# Patient Record
Sex: Male | Born: 1935 | Race: White | Hispanic: No | Marital: Married | Smoking: Never smoker
Health system: Southern US, Community
[De-identification: ages and names within clinical notes are randomized; demographics above are authoritative.]

## PROBLEM LIST (undated history)

## (undated) DIAGNOSIS — B070 Plantar wart: Secondary | ICD-10-CM

## (undated) DIAGNOSIS — I2510 Atherosclerotic heart disease of native coronary artery without angina pectoris: Secondary | ICD-10-CM

## (undated) DIAGNOSIS — I739 Peripheral vascular disease, unspecified: Secondary | ICD-10-CM

## (undated) DIAGNOSIS — M810 Age-related osteoporosis without current pathological fracture: Secondary | ICD-10-CM

## (undated) DIAGNOSIS — K219 Gastro-esophageal reflux disease without esophagitis: Secondary | ICD-10-CM

## (undated) DIAGNOSIS — Z9289 Personal history of other medical treatment: Secondary | ICD-10-CM

## (undated) DIAGNOSIS — M1990 Unspecified osteoarthritis, unspecified site: Secondary | ICD-10-CM

## (undated) DIAGNOSIS — J309 Allergic rhinitis, unspecified: Secondary | ICD-10-CM

## (undated) DIAGNOSIS — N529 Male erectile dysfunction, unspecified: Secondary | ICD-10-CM

## (undated) DIAGNOSIS — E785 Hyperlipidemia, unspecified: Secondary | ICD-10-CM

## (undated) DIAGNOSIS — J984 Other disorders of lung: Secondary | ICD-10-CM

## (undated) DIAGNOSIS — I10 Essential (primary) hypertension: Secondary | ICD-10-CM

## (undated) DIAGNOSIS — Z8719 Personal history of other diseases of the digestive system: Secondary | ICD-10-CM

## (undated) DIAGNOSIS — N200 Calculus of kidney: Secondary | ICD-10-CM

## (undated) DIAGNOSIS — R319 Hematuria, unspecified: Secondary | ICD-10-CM

## (undated) DIAGNOSIS — M47817 Spondylosis without myelopathy or radiculopathy, lumbosacral region: Secondary | ICD-10-CM

## (undated) DIAGNOSIS — H3530 Unspecified macular degeneration: Secondary | ICD-10-CM

## (undated) DIAGNOSIS — M75120 Complete rotator cuff tear or rupture of unspecified shoulder, not specified as traumatic: Secondary | ICD-10-CM

## (undated) HISTORY — DX: Hyperlipidemia, unspecified: E78.5

## (undated) HISTORY — PX: TONSILLECTOMY: SUR1361

## (undated) HISTORY — DX: Gastro-esophageal reflux disease without esophagitis: K21.9

## (undated) HISTORY — DX: Other disorders of lung: J98.4

## (undated) HISTORY — PX: OTHER SURGICAL HISTORY: SHX169

## (undated) HISTORY — DX: Peripheral vascular disease, unspecified: I73.9

## (undated) HISTORY — DX: Calculus of kidney: N20.0

## (undated) HISTORY — PX: CATARACT EXTRACTION, BILATERAL: SHX1313

## (undated) HISTORY — DX: Atherosclerotic heart disease of native coronary artery without angina pectoris: I25.10

## (undated) HISTORY — DX: Age-related osteoporosis without current pathological fracture: M81.0

## (undated) HISTORY — DX: Hematuria, unspecified: R31.9

## (undated) HISTORY — DX: Unspecified macular degeneration: H35.30

## (undated) HISTORY — DX: Personal history of other medical treatment: Z92.89

## (undated) HISTORY — DX: Essential (primary) hypertension: I10

## (undated) HISTORY — PX: CARDIAC CATHETERIZATION: SHX172

## (undated) HISTORY — DX: Complete rotator cuff tear or rupture of unspecified shoulder, not specified as traumatic: M75.120

## (undated) HISTORY — DX: Allergic rhinitis, unspecified: J30.9

## (undated) HISTORY — DX: Spondylosis without myelopathy or radiculopathy, lumbosacral region: M47.817

## (undated) HISTORY — DX: Male erectile dysfunction, unspecified: N52.9

## (undated) HISTORY — DX: Plantar wart: B07.0

## (undated) HISTORY — DX: Unspecified osteoarthritis, unspecified site: M19.90

---

## 1958-01-03 HISTORY — PX: GANGLION CYST EXCISION: SHX1691

## 2001-06-25 LAB — HM COLONOSCOPY

## 2006-01-14 DIAGNOSIS — K219 Gastro-esophageal reflux disease without esophagitis: Secondary | ICD-10-CM | POA: Insufficient documentation

## 2006-05-16 ENCOUNTER — Ambulatory Visit: Payer: Self-pay | Admitting: Internal Medicine

## 2006-05-18 LAB — CONVERTED CEMR LAB
AST: 17 units/L (ref 0–37)
Albumin: 3.6 g/dL (ref 3.5–5.2)
BUN: 22 mg/dL (ref 6–23)
HDL: 51.6 mg/dL (ref >39.0)
Total Bilirubin: 0.9 mg/dL (ref 0.3–1.2)
Triglycerides: 65 mg/dL (ref 0–149)

## 2006-06-16 ENCOUNTER — Ambulatory Visit: Payer: Self-pay | Admitting: Cardiovascular Disease

## 2006-06-21 ENCOUNTER — Ambulatory Visit: Payer: Self-pay | Admitting: Internal Medicine

## 2006-06-23 ENCOUNTER — Ambulatory Visit: Payer: Self-pay | Admitting: Internal Medicine

## 2006-06-23 LAB — CONVERTED CEMR LAB
HDL: 60.8 mg/dL (ref >39.0)
LDL Cholesterol: 92 mg/dL (ref 0–99)
Total CHOL/HDL Ratio: 2.8
VLDL: 18 mg/dL (ref 0–40)
Vit D, 1,25-Dihydroxy: 37 (ref 20–57)

## 2006-08-26 ENCOUNTER — Ambulatory Visit: Payer: Self-pay | Admitting: Internal Medicine

## 2006-09-25 ENCOUNTER — Ambulatory Visit: Payer: Self-pay | Admitting: Internal Medicine

## 2006-10-10 ENCOUNTER — Encounter: Admission: RE | Admit: 2006-10-10 | Discharge: 2006-11-16 | Payer: Self-pay | Admitting: Internal Medicine

## 2006-10-17 ENCOUNTER — Ambulatory Visit: Payer: Self-pay | Admitting: Internal Medicine

## 2006-11-01 DIAGNOSIS — M67919 Unspecified disorder of synovium and tendon, unspecified shoulder: Secondary | ICD-10-CM | POA: Insufficient documentation

## 2006-11-01 DIAGNOSIS — M719 Bursopathy, unspecified: Secondary | ICD-10-CM

## 2006-11-01 DIAGNOSIS — M25559 Pain in unspecified hip: Secondary | ICD-10-CM

## 2006-11-01 DIAGNOSIS — E785 Hyperlipidemia, unspecified: Secondary | ICD-10-CM

## 2006-11-01 DIAGNOSIS — I10 Essential (primary) hypertension: Secondary | ICD-10-CM

## 2006-11-01 DIAGNOSIS — M47817 Spondylosis without myelopathy or radiculopathy, lumbosacral region: Secondary | ICD-10-CM

## 2006-11-01 DIAGNOSIS — M75120 Complete rotator cuff tear or rupture of unspecified shoulder, not specified as traumatic: Secondary | ICD-10-CM

## 2006-11-01 DIAGNOSIS — F528 Other sexual dysfunction not due to a substance or known physiological condition: Secondary | ICD-10-CM

## 2006-11-01 DIAGNOSIS — I2510 Atherosclerotic heart disease of native coronary artery without angina pectoris: Secondary | ICD-10-CM | POA: Insufficient documentation

## 2006-11-01 HISTORY — DX: Spondylosis without myelopathy or radiculopathy, lumbosacral region: M47.817

## 2006-11-01 HISTORY — DX: Complete rotator cuff tear or rupture of unspecified shoulder, not specified as traumatic: M75.120

## 2006-11-16 ENCOUNTER — Encounter: Payer: Self-pay | Admitting: Internal Medicine

## 2006-12-07 ENCOUNTER — Ambulatory Visit: Payer: Self-pay | Admitting: Cardiovascular Disease

## 2006-12-07 ENCOUNTER — Ambulatory Visit: Payer: Self-pay

## 2007-01-15 ENCOUNTER — Ambulatory Visit: Payer: Self-pay | Admitting: Internal Medicine

## 2007-01-15 DIAGNOSIS — M549 Dorsalgia, unspecified: Secondary | ICD-10-CM | POA: Insufficient documentation

## 2007-05-09 ENCOUNTER — Telehealth: Payer: Self-pay | Admitting: Internal Medicine

## 2007-07-19 ENCOUNTER — Ambulatory Visit: Payer: Self-pay | Admitting: Internal Medicine

## 2007-07-19 LAB — CONVERTED CEMR LAB
Alkaline Phosphatase: 64 units/L (ref 39–117)
Bilirubin, Direct: 0.1 mg/dL (ref 0.0–0.3)
GFR calc Af Amer: 68 mL/min
GFR calc non Af Amer: 56 mL/min
LDL Cholesterol: 87 mg/dL (ref 0–99)
Potassium: 4 meq/L (ref 3.5–5.1)
Sodium: 139 meq/L (ref 135–145)
TSH: 0.99 microintl units/mL (ref 0.35–5.50)
Total Bilirubin: 0.8 mg/dL (ref 0.3–1.2)
Total CHOL/HDL Ratio: 2.9
VLDL: 13 mg/dL (ref 0–40)

## 2007-07-25 ENCOUNTER — Ambulatory Visit: Payer: Self-pay | Admitting: Internal Medicine

## 2007-07-25 DIAGNOSIS — M810 Age-related osteoporosis without current pathological fracture: Secondary | ICD-10-CM | POA: Insufficient documentation

## 2007-07-25 DIAGNOSIS — I739 Peripheral vascular disease, unspecified: Secondary | ICD-10-CM

## 2007-08-07 ENCOUNTER — Encounter: Payer: Self-pay | Admitting: Internal Medicine

## 2007-08-10 ENCOUNTER — Encounter: Payer: Self-pay | Admitting: Internal Medicine

## 2007-08-13 ENCOUNTER — Encounter: Payer: Self-pay | Admitting: Internal Medicine

## 2007-08-15 ENCOUNTER — Encounter: Payer: Self-pay | Admitting: Internal Medicine

## 2007-12-12 ENCOUNTER — Ambulatory Visit: Payer: Self-pay | Admitting: Cardiovascular Disease

## 2008-01-24 ENCOUNTER — Ambulatory Visit: Payer: Self-pay | Admitting: Internal Medicine

## 2008-01-28 ENCOUNTER — Telehealth (INDEPENDENT_AMBULATORY_CARE_PROVIDER_SITE_OTHER): Payer: Self-pay | Admitting: *Deleted

## 2008-01-29 ENCOUNTER — Ambulatory Visit: Payer: Self-pay | Admitting: Internal Medicine

## 2008-01-30 ENCOUNTER — Encounter: Payer: Self-pay | Admitting: Internal Medicine

## 2008-01-30 ENCOUNTER — Encounter: Admission: RE | Admit: 2008-01-30 | Discharge: 2008-01-30 | Payer: Self-pay | Admitting: Internal Medicine

## 2008-02-05 ENCOUNTER — Other Ambulatory Visit: Admission: RE | Admit: 2008-02-05 | Discharge: 2008-02-05 | Payer: Self-pay | Admitting: Otolaryngology

## 2008-02-05 ENCOUNTER — Encounter: Payer: Self-pay | Admitting: Internal Medicine

## 2008-06-04 ENCOUNTER — Ambulatory Visit: Payer: Self-pay | Admitting: Internal Medicine

## 2008-06-04 DIAGNOSIS — R10814 Left lower quadrant abdominal tenderness: Secondary | ICD-10-CM | POA: Insufficient documentation

## 2008-06-04 DIAGNOSIS — R5383 Other fatigue: Secondary | ICD-10-CM

## 2008-06-05 LAB — CONVERTED CEMR LAB: Vit D, 25-Hydroxy: 26 ng/mL — ABNORMAL LOW (ref 30–89)

## 2008-06-06 LAB — CONVERTED CEMR LAB
ALT: 25 units/L (ref 0–53)
Albumin: 4 g/dL (ref 3.5–5.2)
Alkaline Phosphatase: 53 units/L (ref 39–117)
Basophils Relative: 0.6 % (ref 0.0–3.0)
Bilirubin Urine: NEGATIVE
Bilirubin, Direct: 0.1 mg/dL (ref 0.0–0.3)
CO2: 30 meq/L (ref 19–32)
Chloride: 108 meq/L (ref 96–112)
Eosinophils Absolute: 0.2 10*3/uL (ref 0.0–0.7)
Eosinophils Relative: 3.2 % (ref 0.0–5.0)
Folate: 10.5 ng/mL
Hemoglobin, Urine: NEGATIVE
Hemoglobin: 14.7 g/dL (ref 13.0–17.0)
Ketones, ur: NEGATIVE mg/dL
LDL Cholesterol: 75 mg/dL (ref 0–99)
Leukocytes, UA: NEGATIVE
Lymphocytes Relative: 22.8 % (ref 12.0–46.0)
MCHC: 34.5 g/dL (ref 30.0–36.0)
MCV: 94.8 fL (ref 78.0–100.0)
Monocytes Absolute: 0.6 10*3/uL (ref 0.1–1.0)
Neutro Abs: 3.8 10*3/uL (ref 1.4–7.7)
Nitrite: NEGATIVE
RBC: 4.5 M/uL (ref 4.22–5.81)
Sodium: 143 meq/L (ref 135–145)
Total CHOL/HDL Ratio: 3
Total Protein: 6.9 g/dL (ref 6.0–8.3)
Triglycerides: 108 mg/dL (ref 0.0–149.0)
Urobilinogen, UA: 0.2 (ref 0.0–1.0)
Vitamin B-12: 289 pg/mL (ref 211–911)
WBC: 6 10*3/uL (ref 4.5–10.5)
pH: 7 (ref 5.0–8.0)

## 2008-06-14 ENCOUNTER — Encounter: Admission: RE | Admit: 2008-06-14 | Discharge: 2008-06-14 | Payer: Self-pay | Admitting: Internal Medicine

## 2008-07-02 ENCOUNTER — Telehealth (INDEPENDENT_AMBULATORY_CARE_PROVIDER_SITE_OTHER): Payer: Self-pay | Admitting: *Deleted

## 2008-07-16 ENCOUNTER — Telehealth: Payer: Self-pay | Admitting: Internal Medicine

## 2008-08-28 ENCOUNTER — Encounter: Payer: Self-pay | Admitting: Internal Medicine

## 2008-09-01 ENCOUNTER — Telehealth: Payer: Self-pay | Admitting: Internal Medicine

## 2008-09-04 ENCOUNTER — Encounter: Payer: Self-pay | Admitting: Internal Medicine

## 2008-09-29 ENCOUNTER — Ambulatory Visit: Payer: Self-pay | Admitting: Internal Medicine

## 2008-09-29 DIAGNOSIS — H6690 Otitis media, unspecified, unspecified ear: Secondary | ICD-10-CM | POA: Insufficient documentation

## 2008-09-29 DIAGNOSIS — R42 Dizziness and giddiness: Secondary | ICD-10-CM | POA: Insufficient documentation

## 2008-12-05 ENCOUNTER — Encounter: Payer: Self-pay | Admitting: Cardiovascular Disease

## 2008-12-08 ENCOUNTER — Ambulatory Visit: Payer: Self-pay

## 2008-12-08 ENCOUNTER — Ambulatory Visit: Payer: Self-pay | Admitting: Cardiovascular Disease

## 2009-01-30 ENCOUNTER — Ambulatory Visit: Payer: Self-pay | Admitting: Internal Medicine

## 2009-01-30 DIAGNOSIS — H3530 Unspecified macular degeneration: Secondary | ICD-10-CM | POA: Insufficient documentation

## 2009-01-30 DIAGNOSIS — R1909 Other intra-abdominal and pelvic swelling, mass and lump: Secondary | ICD-10-CM

## 2009-01-30 HISTORY — DX: Unspecified macular degeneration: H35.30

## 2009-01-30 LAB — CONVERTED CEMR LAB
Albumin: 4.1 g/dL (ref 3.5–5.2)
Alkaline Phosphatase: 61 units/L (ref 39–117)
Basophils Absolute: 0 10*3/uL (ref 0.0–0.1)
Bilirubin, Direct: 0.2 mg/dL (ref 0.0–0.3)
CO2: 31 meq/L (ref 19–32)
Calcium: 9.2 mg/dL (ref 8.4–10.5)
Creatinine, Ser: 1.3 mg/dL (ref 0.4–1.5)
GFR calc non Af Amer: 56.05 mL/min (ref >60)
HCT: 44.3 % (ref 39.0–52.0)
Hemoglobin, Urine: NEGATIVE
Hemoglobin: 14.4 g/dL (ref 13.0–17.0)
Lymphs Abs: 2.2 10*3/uL (ref 0.7–4.0)
MCHC: 32.4 g/dL (ref 30.0–36.0)
MCV: 97 fL (ref 78.0–100.0)
Monocytes Absolute: 0.6 10*3/uL (ref 0.1–1.0)
Monocytes Relative: 9.3 % (ref 3.0–12.0)
Neutro Abs: 3.7 10*3/uL (ref 1.4–7.7)
Nitrite: NEGATIVE
Platelets: 227 10*3/uL (ref 150.0–400.0)
RDW: 12.3 % (ref 11.5–14.6)
Sodium: 143 meq/L (ref 135–145)
Specific Gravity, Urine: 1.025 (ref 1.000–1.030)
Total Protein, Urine: NEGATIVE mg/dL
Total Protein: 7.1 g/dL (ref 6.0–8.3)
Urobilinogen, UA: 0.2 (ref 0.0–1.0)

## 2009-02-02 ENCOUNTER — Ambulatory Visit: Payer: Self-pay | Admitting: Cardiology

## 2009-03-04 ENCOUNTER — Ambulatory Visit: Payer: Self-pay | Admitting: Internal Medicine

## 2009-03-04 DIAGNOSIS — M25519 Pain in unspecified shoulder: Secondary | ICD-10-CM

## 2009-03-04 DIAGNOSIS — N529 Male erectile dysfunction, unspecified: Secondary | ICD-10-CM | POA: Insufficient documentation

## 2009-05-12 ENCOUNTER — Ambulatory Visit: Payer: Self-pay | Admitting: Internal Medicine

## 2009-05-12 DIAGNOSIS — J309 Allergic rhinitis, unspecified: Secondary | ICD-10-CM | POA: Insufficient documentation

## 2009-05-21 ENCOUNTER — Encounter: Payer: Self-pay | Admitting: Internal Medicine

## 2009-05-21 ENCOUNTER — Ambulatory Visit: Payer: Self-pay

## 2009-05-21 ENCOUNTER — Ambulatory Visit: Payer: Self-pay | Admitting: Internal Medicine

## 2009-05-21 ENCOUNTER — Ambulatory Visit (HOSPITAL_COMMUNITY): Admission: RE | Admit: 2009-05-21 | Discharge: 2009-05-21 | Payer: Self-pay | Admitting: Internal Medicine

## 2009-05-25 ENCOUNTER — Telehealth: Payer: Self-pay | Admitting: Internal Medicine

## 2009-08-05 ENCOUNTER — Telehealth (INDEPENDENT_AMBULATORY_CARE_PROVIDER_SITE_OTHER): Payer: Self-pay | Admitting: *Deleted

## 2009-08-07 ENCOUNTER — Encounter: Payer: Self-pay | Admitting: Internal Medicine

## 2009-09-10 ENCOUNTER — Ambulatory Visit: Payer: Self-pay | Admitting: Internal Medicine

## 2009-09-10 DIAGNOSIS — B070 Plantar wart: Secondary | ICD-10-CM

## 2009-09-10 DIAGNOSIS — M899 Disorder of bone, unspecified: Secondary | ICD-10-CM | POA: Insufficient documentation

## 2009-09-10 DIAGNOSIS — M949 Disorder of cartilage, unspecified: Secondary | ICD-10-CM

## 2009-09-10 HISTORY — DX: Plantar wart: B07.0

## 2009-10-01 ENCOUNTER — Encounter: Payer: Self-pay | Admitting: Internal Medicine

## 2009-10-07 ENCOUNTER — Emergency Department (HOSPITAL_COMMUNITY): Admission: EM | Admit: 2009-10-07 | Discharge: 2009-10-07 | Payer: Self-pay | Admitting: Emergency Medicine

## 2009-10-07 ENCOUNTER — Ambulatory Visit: Payer: Self-pay | Admitting: Cardiovascular Disease

## 2009-10-13 ENCOUNTER — Telehealth (INDEPENDENT_AMBULATORY_CARE_PROVIDER_SITE_OTHER): Payer: Self-pay | Admitting: Radiology

## 2009-10-14 ENCOUNTER — Ambulatory Visit: Payer: Self-pay | Admitting: Cardiovascular Disease

## 2009-10-14 ENCOUNTER — Encounter: Payer: Self-pay | Admitting: Cardiovascular Disease

## 2009-10-14 ENCOUNTER — Encounter (HOSPITAL_COMMUNITY)
Admission: RE | Admit: 2009-10-14 | Discharge: 2009-10-30 | Payer: Self-pay | Source: Home / Self Care | Admitting: Cardiovascular Disease

## 2009-10-14 ENCOUNTER — Ambulatory Visit: Payer: Self-pay

## 2009-10-19 ENCOUNTER — Ambulatory Visit: Payer: Self-pay | Admitting: Cardiovascular Disease

## 2009-12-24 ENCOUNTER — Ambulatory Visit: Payer: Self-pay | Admitting: Internal Medicine

## 2009-12-24 DIAGNOSIS — IMO0002 Reserved for concepts with insufficient information to code with codable children: Secondary | ICD-10-CM

## 2010-01-25 ENCOUNTER — Other Ambulatory Visit: Payer: Self-pay | Admitting: Internal Medicine

## 2010-01-25 ENCOUNTER — Ambulatory Visit
Admission: RE | Admit: 2010-01-25 | Discharge: 2010-01-25 | Payer: Self-pay | Source: Home / Self Care | Attending: Internal Medicine | Admitting: Internal Medicine

## 2010-01-25 DIAGNOSIS — J984 Other disorders of lung: Secondary | ICD-10-CM

## 2010-01-25 HISTORY — DX: Other disorders of lung: J98.4

## 2010-01-25 LAB — CBC WITH DIFFERENTIAL/PLATELET
Basophils Relative: 0.5 % (ref 0.0–3.0)
Eosinophils Relative: 3.2 % (ref 0.0–5.0)
Lymphocytes Relative: 26.8 % (ref 12.0–46.0)
Monocytes Relative: 8.6 % (ref 3.0–12.0)
Neutrophils Relative %: 60.9 % (ref 43.0–77.0)
RBC: 4.59 Mil/uL (ref 4.22–5.81)
WBC: 6.1 10*3/uL (ref 4.5–10.5)

## 2010-01-25 LAB — HEPATIC FUNCTION PANEL
ALT: 21 U/L (ref 0–53)
Alkaline Phosphatase: 61 U/L (ref 39–117)
Bilirubin, Direct: 0.1 mg/dL (ref 0.0–0.3)
Total Protein: 6.7 g/dL (ref 6.0–8.3)

## 2010-01-25 LAB — BASIC METABOLIC PANEL
Calcium: 9.3 mg/dL (ref 8.4–10.5)
Creatinine, Ser: 1.3 mg/dL (ref 0.4–1.5)

## 2010-01-25 LAB — LIPID PANEL
LDL Cholesterol: 61 mg/dL (ref 0–99)
Total CHOL/HDL Ratio: 2
Triglycerides: 98 mg/dL (ref 0.0–149.0)

## 2010-01-26 ENCOUNTER — Encounter: Payer: Self-pay | Admitting: Internal Medicine

## 2010-01-26 ENCOUNTER — Ambulatory Visit
Admission: RE | Admit: 2010-01-26 | Discharge: 2010-01-26 | Payer: Self-pay | Source: Home / Self Care | Attending: Internal Medicine | Admitting: Internal Medicine

## 2010-01-31 LAB — CONVERTED CEMR LAB
Basophils Relative: 0.6 % (ref 0.0–3.0)
Calcium: 9.2 mg/dL (ref 8.4–10.5)
Eosinophils Relative: 2.7 % (ref 0.0–5.0)
GFR calc non Af Amer: 62.03 mL/min (ref >60)
HCT: 42.3 % (ref 39.0–52.0)
Hemoglobin: 14.5 g/dL (ref 13.0–17.0)
Lymphocytes Relative: 22.8 % (ref 12.0–46.0)
Monocytes Relative: 7.5 % (ref 3.0–12.0)
Neutro Abs: 4.2 10*3/uL (ref 1.4–7.7)
Potassium: 4.3 meq/L (ref 3.5–5.1)
RBC: 4.5 M/uL (ref 4.22–5.81)
Sodium: 142 meq/L (ref 135–145)

## 2010-02-04 NOTE — Assessment & Plan Note (Signed)
Summary: 1 MO ROV /NWS #   Vital Signs:  Patient profile:   77 year old [name] Height:      67 inches Weight:      133 pounds BMI:     20.91 O2 Sat:      98 % on Room air Temp:     96.7 degrees F oral Pulse rate:   89 / minute BP sitting:   140 / 70  (left arm) Cuff size:   regular  Vitals Entered ByZella Ball Ewing (March 04, 2009 10:22 AM)  O2 Flow:  Room air CC: 1 Mo Followup/RE   Primary Care Provider:  Corwin Levins MD  CC:  1 Mo Followup/RE.  History of Present Illness: c/o bilat increased shoudler pain  - has hx of bursitis on the left, and rot cuff tendonitis on the right; conts to lift the dumbells for excercise on occasion; has seen ortho before  - dr Farris Has; no neck pain and no radicular symptoms;  Pt denies CP, sob, doe, wheezing, orthopnea, pnd, worsening LE edema, palps, dizziness or syncope   Pt denies new neuro symptoms such as headache, facial or extremity weakness   Recent CT neg for groin mass and pt states lump has resolved;  no recent fever, wt loss, night sweats , or other consittutional symtpoms.    Problems Prior to Update: 1)  Erectile Dysfunction, Organic  (ICD-607.84) 2)  Shoulder Pain, Bilateral  (ICD-719.41) 3)  Preventive Health Care  (ICD-V70.0) 4)  Groin Mass, Left  (ICD-789.39) 5)  Macular Degeneration, Bilateral  (ICD-362.50) 6)  Coronary Artery Disease  (ICD-414.00) 7)  Peripheral Vascular Disease  (ICD-443.9) 8)  Hypertension  (ICD-401.9) 9)  Dyslipidemia  (ICD-272.4) 10)  Gerd  (ICD-530.81) 11)  Fatigue  (ICD-780.79) 12)  Vertigo  (ICD-780.4) 13)  Rash-nonvesicular  (ICD-782.1) 14)  Otitis Media, Acute, Left  (ICD-382.9) 15)  Abdominal Tenderness Left Lower Quadrant  (ICD-789.64) 16)  Swelling Mass or Lump in Head and Neck  (ICD-784.2) 17)  Osteoporosis  (ICD-733.00) 18)  Rectal Pain  (ICD-569.42) 19)  Back Pain  (ICD-724.5) 20)  Hip Pain, Chronic  (ICD-719.45) 21)  Erectile Dysfunction  (ICD-302.72) 22)  Bursitis, Left Shoulder   (ICD-726.10) 23)  Hx of Rotator Cuff Tear  (ICD-727.61) 24)  Spondylosis, Lumbar  (ICD-721.3)  Medications Prior to Update: 1)  Lisinopril 10 Mg  Tabs (Lisinopril) .... One By Mouth Once Daily 2)  Nexium 40 Mg  Cpdr (Esomeprazole Magnesium) .Marland Kitchen.. 1 By Mouth Once Daily 3)  Low-Dose Aspirin 81 Mg  Tabs (Aspirin) .... One By Mouth Once Daily 4)  Crestor 20 Mg  Tabs (Rosuvastatin Calcium) .Marland Kitchen.. 1 By Mouth Once Daily 5)  Nitroquick 0.4 Mg  Subl (Nitroglycerin) .... One By Mouth Sl As Needed 6)  Tramadol-Acetaminophen 37.5-325 Mg  Tabs (Tramadol-Acetaminophen) .... One By Mouth Two Times A Day As Needed 7)  Vitamin D 1000 Unit Tabs (Cholecalciferol) .Marland Kitchen.. 1 By Mouth Once Daily 8)  Centrum Silver  Tabs (Multiple Vitamins-Minerals) .Marland Kitchen.. 1 By Mouth Once Daily  Current Medications (verified): 1)  Lisinopril 10 Mg  Tabs (Lisinopril) .... One By Mouth Once Daily 2)  Nexium 40 Mg  Cpdr (Esomeprazole Magnesium) .Marland Kitchen.. 1 By Mouth Once Daily 3)  Low-Dose Aspirin 81 Mg  Tabs (Aspirin) .... One By Mouth Once Daily 4)  Crestor 20 Mg  Tabs (Rosuvastatin Calcium) .Marland Kitchen.. 1 By Mouth Once Daily 5)  Nitroquick 0.4 Mg  Subl (Nitroglycerin) .... One By Mouth Sl As Needed  6)  Tramadol-Acetaminophen 37.5-325 Mg  Tabs (Tramadol-Acetaminophen) .... One By Mouth Two Times A Day As Needed 7)  Vitamin D 1000 Unit Tabs (Cholecalciferol) .Marland Kitchen.. 1 By Mouth Once Daily 8)  Centrum Silver  Tabs (Multiple Vitamins-Minerals) .Marland Kitchen.. 1 By Mouth Once Daily 9)  Viagra 100 Mg Tabs (Sildenafil Citrate) .Marland Kitchen.. 1 By Mouth Once Daily As Needed  Allergies (verified): 1)  ! Protonix (Pantoprazole Sodium) 2)  ! Aciphex (Rabeprazole Sodium) 3)  ! Biaxin 4)  * Cialis  Past History:  Past Surgical History: Last updated: 12/04/2008 Cataract extraction - bilat Tonsillectomy Inguinal herniorrhaphy - bilat  Social History: Last updated: 12/04/2008 Retired Never Smoked Alcohol use-no He has been married for the last 57 years.  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   Risk Factors: Smoking Status: never (01/15/2007)  Past Medical History: Current Problems:  CORONARY ARTERY DISEASE (ICD-414.00)-Nonobstructive PERIPHERAL VASCULAR DISEASE (ICD-443.9) HYPERTENSION (ICD-401.9) DYSLIPIDEMIA (ICD-272.4) GERD (ICD-530.81) FATIGUE (ICD-780.79) VERTIGO (ICD-780.4) RASH-NONVESICULAR (ICD-782.1) OTITIS MEDIA, ACUTE, LEFT (ICD-382.9) ABDOMINAL TENDERNESS LEFT LOWER QUADRANT (ICD-789.64) SWELLING MASS OR LUMP IN HEAD AND NECK (ICD-784.2) OSTEOPOROSIS (ICD-733.00) RECTAL PAIN (ICD-569.42) BACK PAIN (ICD-724.5) HIP PAIN, CHRONIC (ICD-719.45) ERECTILE DYSFUNCTION (ICD-302.72) BURSITIS, LEFT SHOULDER (ICD-726.10) Hx of ROTATOR CUFF TEAR (ICD-727.61) SPONDYLOSIS, LUMBAR (ICD-721.3) macular degeneration  - dr Hazle Quant E.D.  Review of Systems       all otherwise negative per pt - except for onset ED symtpoms recently  - requests viagra trial  Physical Exam  General:  alert and well-developed.   Head:  normocephalic and atraumatic.   Eyes:  pupils round.   Ears:  R ear normal and L ear normal.   Nose:  no external deformity and no nasal discharge.   Mouth:  no gingival abnormalities and pharynx pink and moist.   Neck:  supple and no masses.   Lungs:  normal respiratory effort and normal breath sounds.   Heart:  normal rate and regular rhythm.   Msk:  bilat shoudler with tender to bicep and bursa areas, worse to abduct Extremities:  no edema, no erythema  Neurologic:  cranial nerves II-XII intact and strength normal in all extremities.   Cervical Nodes:  No lymphadenopathy noted Axillary Nodes:  No palpable lymphadenopathy Inguinal Nodes:  No significant adenopathy   Impression & Recommendations:  Problem # 1:  SHOULDER PAIN, BILATERAL (ICD-719.41)  His updated medication list for this problem includes:    Low-dose Aspirin 81 Mg Tabs (Aspirin)  ..... One by mouth once daily    Tramadol-acetaminophen 37.5-325 Mg Tabs (Tramadol-acetaminophen) ..... One by mouth two times a day as needed  Orders: Orthopedic Surgeon Referral (Ortho Surgeon) treat as above, f/u any worsening signs or symptoms , refer to ortho  Problem # 2:  GROIN MASS, LEFT (ICD-789.39) resolved, ct neg - to follow for any recurrence  Problem # 3:  HYPERTENSION (ICD-401.9)  His updated medication list for this problem includes:    Lisinopril 10 Mg Tabs (Lisinopril) ..... One by mouth once daily  BP today: 140/70 Prior BP: 120/68 (01/30/2009)  Labs Reviewed: K+: 4.1 (01/30/2009) Creat: : 1.3 (01/30/2009)   Chol: 160 (06/04/2008)   HDL: 63.20 (06/04/2008)   LDL: 75 (06/04/2008)   TG: 108.0 (06/04/2008) stable overall by hx and exam, ok to continue meds/tx as is   Problem # 4:  ERECTILE DYSFUNCTION, ORGANIC (ICD-607.84)  for  viagra , with samples and rx today  His updated medication list for this problem includes:    Viagra 100 Mg Tabs (Sildenafil citrate) .Marland Kitchen... 1 by mouth once daily as needed  Complete Medication List: 1)  Lisinopril 10 Mg Tabs (Lisinopril) .... One by mouth once daily 2)  Nexium 40 Mg Cpdr (Esomeprazole magnesium) .Marland Kitchen.. 1 by mouth once daily 3)  Low-dose Aspirin 81 Mg Tabs (Aspirin) .... One by mouth once daily 4)  Crestor 20 Mg Tabs (Rosuvastatin calcium) .Marland Kitchen.. 1 by mouth once daily 5)  Nitroquick 0.4 Mg Subl (Nitroglycerin) .... One by mouth sl as needed 6)  Tramadol-acetaminophen 37.5-325 Mg Tabs (Tramadol-acetaminophen) .... One by mouth two times a day as needed 7)  Vitamin D 1000 Unit Tabs (Cholecalciferol) .Marland Kitchen.. 1 by mouth once daily 8)  Centrum Silver Tabs (Multiple vitamins-minerals) .Marland Kitchen.. 1 by mouth once daily 9)  Viagra 100 Mg Tabs (Sildenafil citrate) .Marland Kitchen.. 1 by mouth once daily as needed  Patient Instructions: 1)  Please take all new medications as prescribed 2)  Continue all previous medications as before this visit  3)  you  are given the samples today 4)  You will be contacted about the referral(s) to: Orthopedic - dr Farris Has 5)  Please schedule a follow-up appointment in 6 months or sooner if needed Prescriptions: VIAGRA 100 MG TABS (SILDENAFIL CITRATE) 1 by mouth once daily as needed  #5 x 11   Entered and Authorized by:   Corwin Levins MD   Signed by:   Corwin Levins MD on 03/04/2009   Method used:   Print then Give to Patient   RxID:   [id number]

## 2010-02-04 NOTE — Medication Information (Signed)
Summary: Nexium Approved/[employer]  Nexium Approved/[employer]   Imported By: Sherian Rein 08/10/2009 12:10:02  _____________________________________________________________________  External Attachment:    Type:   Image     Comment:   External Document

## 2010-02-04 NOTE — Miscellaneous (Signed)
Summary: BONE DENSITY  Clinical Lists Changes  Orders: Added new Test order of T-Lumbar Vertebral Assessment (77082) - Signed 

## 2010-02-04 NOTE — Assessment & Plan Note (Signed)
Summary: LEFT LOWER MASS/ABD--STC   Vital Signs:  Patient profile:   77 year old male Height:      66 inches Weight:      133 pounds BMI:     21.54 O2 Sat:      99 % on Room air Temp:     97 degrees F oral Pulse rate:   66 / minute BP sitting:   120 / 68  (left arm) Cuff size:   regular  Vitals Entered ByZella Ball Ewing (January 30, 2009 11:03 AM)  O2 Flow:  Room air  Preventive Care Screening  Last Flu Shot:    Date:  11/03/2008    Results:  given   CC: Lower left abdominal mass,lump/RE   Primary Care Provider:  Corwin Levins MD  CC:  Lower left abdominal mass and lump/RE.  History of Present Illness: [facility] and then "they got into other things";  somehow he was referred to a cardiologist; who noted a left lower abd lump/ swelling;  he is unaware of any problem such as pain, or change in bowel or bladder , fever, night sweats, wt loss, weakness or falls alhtough he gets "more tired in the legs" easier than last year and attributes this to geriatric decline;  stilldoing yardwork  - rakes leaves and mows the lawn. Had CXR at [facility] but he never heard results Pt denies CP, sob, doe, wheezing, orthopnea, pnd, worsening LE edema, palps, dizziness or syncope   Pt denies new neuro symptoms such as headache, facial or extremity weakness  Here for wellness Diet: Heart Healthy or DM if diabetic Physical Activities: Sedentary Depression/mood screen: Negative Hearing: Intact bilateral wiht mild loss only Visual Acuity: Grossly normal with glasses ADL's: Capable  Fall Risk: None Home Safety: Good End-of-Life Planning: Advance directive - Full code/I agree     Problems Prior to Update: 1)  Groin Mass, Left  (ICD-789.39) 2)  Macular Degeneration, Bilateral  (ICD-362.50) 3)  Coronary Artery Disease  (ICD-414.00) 4)  Peripheral Vascular Disease  (ICD-443.9) 5)  Hypertension  (ICD-401.9) 6)  Dyslipidemia  (ICD-272.4) 7)  Gerd  (ICD-530.81) 8)  Fatigue   (ICD-780.79) 9)  Vertigo  (ICD-780.4) 10)  Rash-nonvesicular  (ICD-782.1) 11)  Otitis Media, Acute, Left  (ICD-382.9) 12)  Abdominal Tenderness Left Lower Quadrant  (ICD-789.64) 13)  Swelling Mass or Lump in Head and Neck  (ICD-784.2) 14)  Osteoporosis  (ICD-733.00) 15)  Rectal Pain  (ICD-569.42) 16)  Back Pain  (ICD-724.5) 17)  Hip Pain, Chronic  (ICD-719.45) 18)  Erectile Dysfunction  (ICD-302.72) 19)  Bursitis, Left Shoulder  (ICD-726.10) 20)  Hx of Rotator Cuff Tear  (ICD-727.61) 21)  Spondylosis, Lumbar  (ICD-721.3)  Medications Prior to Update: 1)  Lisinopril 10 Mg  Tabs (Lisinopril) .... One By Mouth Once Daily 2)  Nexium 40 Mg  Cpdr (Esomeprazole Magnesium) .Marland Kitchen.. 1 By Mouth Once Daily 3)  Low-Dose Aspirin 81 Mg  Tabs (Aspirin) .... One By Mouth Once Daily 4)  Crestor 20 Mg  Tabs (Rosuvastatin Calcium) .Marland Kitchen.. 1 By Mouth Once Daily 5)  Nitroquick 0.4 Mg  Subl (Nitroglycerin) .... One By Mouth Sl As Needed 6)  Tramadol-Acetaminophen 37.5-325 Mg  Tabs (Tramadol-Acetaminophen) .... One By Mouth Two Times A Day As Needed 7)  Vitamin D 1000 Unit Tabs (Cholecalciferol) .Marland Kitchen.. 1 By Mouth Once Daily 8)  Centrum Silver  Tabs (Multiple Vitamins-Minerals) .Marland Kitchen.. 1 By Mouth Once Daily  Current Medications (verified): 1)  Lisinopril 10  Mg  Tabs (Lisinopril) .... One By Mouth Once Daily 2)  Nexium 40 Mg  Cpdr (Esomeprazole Magnesium) .Marland Kitchen.. 1 By Mouth Once Daily 3)  Low-Dose Aspirin 81 Mg  Tabs (Aspirin) .... One By Mouth Once Daily 4)  Crestor 20 Mg  Tabs (Rosuvastatin Calcium) .Marland Kitchen.. 1 By Mouth Once Daily 5)  Nitroquick 0.4 Mg  Subl (Nitroglycerin) .... One By Mouth Sl As Needed 6)  Tramadol-Acetaminophen 37.5-325 Mg  Tabs (Tramadol-Acetaminophen) .... One By Mouth Two Times A Day As Needed 7)  Vitamin D 1000 Unit Tabs (Cholecalciferol) .Marland Kitchen.. 1 By Mouth Once Daily 8)  Centrum Silver  Tabs (Multiple Vitamins-Minerals) .Marland Kitchen.. 1 By Mouth Once Daily  Allergies (verified): 1)  ! Protonix (Pantoprazole  Sodium) 2)  ! Aciphex (Rabeprazole Sodium) 3)  ! Biaxin 4)  * Cialis  Past History:  Past Surgical History: Last updated: 12/04/2008 Cataract extraction - bilat Tonsillectomy Inguinal herniorrhaphy - bilat  Family History: Last updated: 12/04/2008 Notable for heart disease in his father who passed away at age 18. Mother also noted to have coronary artery disease, deceasedat age 79. Also, has a brother that died at age 6 secondary to coronaryartery disease.  sister died 04-18-22 - renal failure  Social History: Last updated: 12/04/2008 Retired Never Smoked Alcohol use-no He has been married for the last 57 years.  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   Risk Factors: Smoking Status: never (01/15/2007)  Past Medical History: Current Problems:  CORONARY ARTERY DISEASE (ICD-414.00)-Nonobstructive PERIPHERAL VASCULAR DISEASE (ICD-443.9) HYPERTENSION (ICD-401.9) DYSLIPIDEMIA (ICD-272.4) GERD (ICD-530.81) FATIGUE (ICD-780.79) VERTIGO (ICD-780.4) RASH-NONVESICULAR (ICD-782.1) OTITIS MEDIA, ACUTE, LEFT (ICD-382.9) ABDOMINAL TENDERNESS LEFT LOWER QUADRANT (ICD-789.64) SWELLING MASS OR LUMP IN HEAD AND NECK (ICD-784.2) OSTEOPOROSIS (ICD-733.00) RECTAL PAIN (ICD-569.42) BACK PAIN (ICD-724.5) HIP PAIN, CHRONIC (ICD-719.45) ERECTILE DYSFUNCTION (ICD-302.72) BURSITIS, LEFT SHOULDER (ICD-726.10) Hx of ROTATOR CUFF TEAR (ICD-727.61) SPONDYLOSIS, LUMBAR (ICD-721.3) macular degeneration  - dr Hazle Quant  Review of Systems  The patient denies anorexia, fever, weight gain, vision loss, decreased hearing, hoarseness, chest pain, syncope, dyspnea on exertion, peripheral edema, prolonged cough, headaches, hemoptysis, abdominal pain, melena, hematochezia, severe indigestion/heartburn, hematuria, incontinence, muscle weakness, suspicious skin lesions, transient blindness, difficulty walking, depression, abnormal  bleeding, enlarged lymph nodes, and angioedema.         all otherwise negative per pt -  Physical Exam  General:  alert and underweight appearing.   Head:  normocephalic and atraumatic.   Eyes:  vision grossly intact, pupils equal, and pupils round.   Ears:  R ear normal and L ear normal.   Nose:  no external deformity and no nasal discharge.   Mouth:  no gingival abnormalities and pharynx pink and moist.   Neck:  supple and no masses.   Lungs:  normal respiratory effort and normal breath sounds.   Heart:  normal rate and regular rhythm.   Abdomen:  soft, non-tender, and normal bowel sounds.   Msk:  no joint tenderness and no joint swelling.   Extremities:  no edema, no erythema  Neurologic:  cranial nerves II-XII intact and strength normal in all extremities.   Cervical Nodes:  none significant Axillary Nodes:  none significant Inguinal Nodes:  none to right;  left with approx 1.5 to 2 cm shoddy firm LA, nontender but nonmobile   Impression & Recommendations:  Problem # 1:  Preventive Health Care (ICD-V70.0)  Overall doing well, age appropriate education and counseling updated and  referral for appropriate preventive services done unless declined, immunizations up to date or declined, diet counseling done if overweight, urged to quit smoking if smokes , most recent labs reviewed and current ordered if appropriate, ecg reviewed or declined (interpretation per ECG scanned in the EMR if done); information regarding Medicare Prevention requirements given if appropriate   Orders: First annual wellness visit with prevention plan  (E4540)  Problem # 2:  GROIN MASS, LEFT (ICD-789.39)  etiolgy not clear , but does not seem abscess or acute LA;  will need routine labs, adn CT abd/pelvis with CM, as well as FNA left groin mas; also for cxr today  Orders: Radiology Referral (Radiology) T-2 View CXR, Same Day (71020.5TC) Misc. Referral (Misc. Ref) TLB-CBC Platelet - w/Differential  (85025-CBCD) TLB-BMP (Basic Metabolic Panel-BMET) (80048-METABOL) TLB-Hepatic/Liver Function Pnl (80076-HEPATIC) TLB-Sedimentation Rate (ESR) (85652-ESR) TLB-Udip ONLY (81003-UDIP)  Problem # 3:  HYPERTENSION (ICD-401.9)  His updated medication list for this problem includes:    Lisinopril 10 Mg Tabs (Lisinopril) ..... One by mouth once daily  BP today: 120/68 Prior BP: 114/70 (12/08/2008)  Labs Reviewed: K+: 4.5 (06/04/2008) Creat: : 1.3 (06/04/2008)   Chol: 160 (06/04/2008)   HDL: 63.20 (06/04/2008)   LDL: 75 (06/04/2008)   TG: 108.0 (06/04/2008) stable overall by hx and exam, ok to continue meds/tx as is   Complete Medication List: 1)  Lisinopril 10 Mg Tabs (Lisinopril) .... One by mouth once daily 2)  Nexium 40 Mg Cpdr (Esomeprazole magnesium) .Marland Kitchen.. 1 by mouth once daily 3)  Low-dose Aspirin 81 Mg Tabs (Aspirin) .... One by mouth once daily 4)  Crestor 20 Mg Tabs (Rosuvastatin calcium) .Marland Kitchen.. 1 by mouth once daily 5)  Nitroquick 0.4 Mg Subl (Nitroglycerin) .... One by mouth sl as needed 6)  Tramadol-acetaminophen 37.5-325 Mg Tabs (Tramadol-acetaminophen) .... One by mouth two times a day as needed 7)  Vitamin D 1000 Unit Tabs (Cholecalciferol) .Marland Kitchen.. 1 by mouth once daily 8)  Centrum Silver Tabs (Multiple vitamins-minerals) .Marland Kitchen.. 1 by mouth once daily  Patient Instructions: 1)  Please go to the Lab in the basement for your blood and/or urine tests today  2)  Please go to Radiology in the basement level for your X-Ray today  3)  You will be contacted about the referral(s) to: CT scan with the biopsy 4)  Please schedule a follow-up appointment in 1 month.

## 2010-02-04 NOTE — Assessment & Plan Note (Signed)
Summary: Cardiology Nuclear Testing  Nuclear Med Background Indications for Stress Test: Evaluation for Ischemia, Post Hospital  Indications Comments: [facility] ER DC'D 10/07/09  History: Echo, Heart Catheterization  History Comments: ECHO EF =55-60% ,'06 CATH 50%LAD  Symptoms: Diaphoresis, Fatigue  Symptoms Comments: ATYPICAL CHEST/BACK PAIN-Back Pain   Nuclear Pre-Procedure Cardiac Risk Factors: Carotid Disease, Family History - CAD, Hypertension, Lipids, PVD, RBBB Caffeine/Decaff Intake: None NPO After: 7:00 AM Lungs: clear IV 0.9% NS with Angio Cath: 20g     IV Site: R Antecubital IV Started by: Irean Hong, RN Chest Size (in) 38     Height (in): 68 Weight (lb): 130 BMI: 19.84  Nuclear Med Study 1 or 2 day study:  1 day     Stress Test Type:  Stress Reading MD:  Charlton Haws, MD     Referring MD:  Dr. Tonny Bollman Resting Radionuclide:  Technetium 68m Tetrofosmin     Resting Radionuclide Dose:  11 mCi  Stress Radionuclide:  Technetium 43m Tetrofosmin     Stress Radionuclide Dose:  33 mCi   Stress Protocol Exercise Time (min):  7:00 min     Max HR:  148 bpm     Predicted Max HR:  137 bpm  Max Systolic BP: 194 mm Hg     Percent Max HR:  108.03 %     METS: 7.00 Rate Pressure Product:  04540    Stress Test Technologist:  Bonnita Levan, RN     Nuclear Technologist:  Doyne Keel, CNMT  Rest Procedure  Myocardial perfusion imaging was performed at rest 45 minutes following the intravenous administration of Technetium 48m Tetrofosmin.  Stress Procedure  The patient exercised for five minutes and forty seconds .  The patient stopped due to leg fatigue and denied any chest pain.  There were non specific T wave changes.  Technetium 45m Tetrofosmin was injected at peak exercise and myocardial perfusion imaging was performed after a brief delay.  QPS Raw Data Images:  Normal; no motion artifact; normal heart/lung ratio. Stress Images:  Normal homogeneous uptake in all areas of the  myocardium. Rest Images:  Normal homogeneous uptake in all areas of the myocardium. Subtraction (SDS):  Normal Transient Ischemic Dilatation:  0.93  (Normal <1.22)  Lung/Heart Ratio:  0.30  (Normal <0.45)  Quantitative Gated Spect Images QGS EDV:  62 ml QGS ESV:  22 ml QGS EF:  64 % QGS cine images:  normal  Findings Normal nuclear study      Overall Impression  Exercise Capacity: Fair exercise capacity. BP Response: Normal blood pressure response. Clinical Symptoms: Leg Fatigue ECG Impression: No significant ST segment change suggestive of ischemia. Overall Impression: Normal stress nuclear study.  Appended Document: Cardiology Nuclear Testing please notify pt of normal result. He has upcoming appt with me next wk. thx  Appended Document: Cardiology Nuclear Testing**10/17 appt** Pt aware of results by phone.

## 2010-02-04 NOTE — Progress Notes (Signed)
Summary: PA-Nexium  Phone Note From Pharmacy   Summary of Call: PA-Nexium approved over the phone  called [phone] for approval,  approved 05/09/09-5/7-12.Pt and pharmacy aware . Initial call taken by: Dagoberto Reef,  August 05, 2009 4:28 PM

## 2010-02-04 NOTE — Consult Note (Signed)
Summary: [facility]   Imported By: Lennie Odor 10/07/2009 15:01:16  _____________________________________________________________________  External Attachment:    Type:   Image     Comment:   External Document

## 2010-02-04 NOTE — Assessment & Plan Note (Signed)
Summary: 6 MOS F/U// # / CD   Vital Signs:  Patient profile:   77 year old male Height:      68 inches Weight:      134.25 pounds BMI:     20.49 O2 Sat:      97 % on Room air Temp:     97 degrees F oral Pulse rate:   82 / minute BP sitting:   110 / 62  (left arm) Cuff size:   regular  Vitals Entered By: Zella Ball Ewing CMA (AAMA) (September 10, 2009 10:04 AM)  O2 Flow:  Room air CC: 6 month ROV/RE   Primary Care Janesha Brissette:  Corwin Levins MD  CC:  6 month ROV/RE.  History of Present Illness: here for routine f/u - c/o warty lesion to the left 3rd mtp plantar area with marked tender to walk;  also with new lesion approx 2 mo onset left face near the left eye nonhealing and nonpainful;  vertigo resolved;  also with occas pain to a left hammertoe;  has ongoing chronic pain to the right shoulder known chronic rotater cuff tear; but also with pain to the left shoulder for 2 wks , mild to mod, worse to abduct the arm and lie on his side;  Pt denies CP, worsening sob, doe, wheezing, orthopnea, pnd, worsening LE edema, palps, dizziness or syncope  Pt denies new neuro symptoms such as headache, facial or extremity weakness  No fever, wt loss, night sweats, loss of appetite or other constitutional symptoms   Problems Prior to Update: 1)  Shoulder Pain, Left  (ICD-719.41) 2)  Lesion, Face  (ICD-238.2) 3)  Plantar Wart  (ICD-078.12) 4)  Osteopenia  (ICD-733.90) 5)  Allergic Rhinitis  (ICD-477.9) 6)  Postural Lightheadedness  (ICD-780.4) 7)  Erectile Dysfunction, Organic  (ICD-607.84) 8)  Shoulder Pain, Bilateral  (ICD-719.41) 9)  Preventive Health Care  (ICD-V70.0) 10)  Groin Mass, Left  (ICD-789.39) 11)  Macular Degeneration, Bilateral  (ICD-362.50) 12)  Coronary Artery Disease  (ICD-414.00) 13)  Peripheral Vascular Disease  (ICD-443.9) 14)  Hypertension  (ICD-401.9) 15)  Dyslipidemia  (ICD-272.4) 16)  Gerd  (ICD-530.81) 17)  Fatigue  (ICD-780.79) 18)  Vertigo  (ICD-780.4) 19)   Rash-nonvesicular  (ICD-782.1) 20)  Otitis Media, Acute, Left  (ICD-382.9) 21)  Abdominal Tenderness Left Lower Quadrant  (ICD-789.64) 22)  Swelling Mass or Lump in Head and Neck  (ICD-784.2) 23)  Osteoporosis  (ICD-733.00) 24)  Rectal Pain  (ICD-569.42) 25)  Back Pain  (ICD-724.5) 26)  Hip Pain, Chronic  (ICD-719.45) 27)  Erectile Dysfunction  (ICD-302.72) 28)  Bursitis, Left Shoulder  (ICD-726.10) 29)  Hx of Rotator Cuff Tear  (ICD-727.61) 30)  Spondylosis, Lumbar  (ICD-721.3)  Medications Prior to Update: 1)  Lisinopril 10 Mg  Tabs (Lisinopril) .... One By Mouth Once Daily 2)  Nexium 40 Mg  Cpdr (Esomeprazole Magnesium) .Marland Kitchen.. 1 By Mouth Once Daily 3)  Low-Dose Aspirin 81 Mg  Tabs (Aspirin) .... One By Mouth Once Daily 4)  Crestor 20 Mg  Tabs (Rosuvastatin Calcium) .Marland Kitchen.. 1 By Mouth Once Daily 5)  Nitroquick 0.4 Mg  Subl (Nitroglycerin) .... One By Mouth Sl As Needed 6)  Tramadol-Acetaminophen 37.5-325 Mg  Tabs (Tramadol-Acetaminophen) .... One By Mouth Two Times A Day As Needed 7)  Vitamin D 1000 Unit Tabs (Cholecalciferol) .Marland Kitchen.. 1 By Mouth Once Daily 8)  Centrum Silver  Tabs (Multiple Vitamins-Minerals) .Marland Kitchen.. 1 By Mouth Once Daily 9)  Viagra 100 Mg Tabs (Sildenafil Citrate) .Marland KitchenMarland KitchenMarland Kitchen  1 By Mouth Once Daily As Needed 10)  Claritin 10 Mg Tabs (Loratadine) .Marland Kitchen.. 1 By Mouth Once Daily As Needed  Current Medications (verified): 1)  Lisinopril 10 Mg  Tabs (Lisinopril) .... One By Mouth Once Daily 2)  Nexium 40 Mg  Cpdr (Esomeprazole Magnesium) .Marland Kitchen.. 1 By Mouth Once Daily 3)  Low-Dose Aspirin 81 Mg  Tabs (Aspirin) .... One By Mouth Once Daily 4)  Crestor 20 Mg  Tabs (Rosuvastatin Calcium) .Marland Kitchen.. 1 By Mouth Once Daily 5)  Nitroquick 0.4 Mg  Subl (Nitroglycerin) .... One By Mouth Sl As Needed 6)  Tramadol-Acetaminophen 37.5-325 Mg  Tabs (Tramadol-Acetaminophen) .... One By Mouth Two Times A Day As Needed 7)  Vitamin D 1000 Unit Tabs (Cholecalciferol) .Marland Kitchen.. 1 By Mouth Once Daily 8)  Centrum Silver  Tabs  (Multiple Vitamins-Minerals) .Marland Kitchen.. 1 By Mouth Once Daily 9)  Viagra 100 Mg Tabs (Sildenafil Citrate) .Marland Kitchen.. 1 By Mouth Once Daily As Needed 10)  Claritin 10 Mg Tabs (Loratadine) .Marland Kitchen.. 1 By Mouth Once Daily As Needed 11)  Tens Unit Pad .... Use Asd  Allergies (verified): 1)  ! Protonix (Pantoprazole Sodium) 2)  ! Aciphex (Rabeprazole Sodium) 3)  ! Biaxin 4)  * Cialis  Past History:  Past Surgical History: Last updated: 12/04/2008 Cataract extraction - bilat Tonsillectomy Inguinal herniorrhaphy - bilat  Social History: Last updated: 12/04/2008 Retired Never Smoked Alcohol use-no He has been married for the last 57 years.  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   Risk Factors: Smoking Status: never (01/15/2007)  Past Medical History: Current Problems:  CORONARY ARTERY DISEASE (ICD-414.00)-Nonobstructive PERIPHERAL VASCULAR DISEASE (ICD-443.9) HYPERTENSION (ICD-401.9) DYSLIPIDEMIA (ICD-272.4) GERD (ICD-530.81) FATIGUE (ICD-780.79) VERTIGO (ICD-780.4) RASH-NONVESICULAR (ICD-782.1) OTITIS MEDIA, ACUTE, LEFT (ICD-382.9) ABDOMINAL TENDERNESS LEFT LOWER QUADRANT (ICD-789.64) SWELLING MASS OR LUMP IN HEAD AND NECK (ICD-784.2) OSTEOPOROSIS (ICD-733.00) RECTAL PAIN (ICD-569.42) BACK PAIN (ICD-724.5) HIP PAIN, CHRONIC (ICD-719.45) ERECTILE DYSFUNCTION (ICD-302.72) BURSITIS, LEFT SHOULDER (ICD-726.10) Hx of ROTATOR CUFF TEAR (ICD-727.61) SPONDYLOSIS, LUMBAR (ICD-721.3) macular degeneration  - dr Hazle Quant E.D. Allergic rhinitis Osteopenia  Review of Systems       all otherwise negative per pt -    Physical Exam  General:  alert and well-developed.   Head:  normocephalic and atraumatic.   Eyes:  vision grossly intact, pupils equal, and pupils round.   Ears:  bilat tm's mild red, sinus nontender Nose:  nasal dischargemucosal pallor and mucosal edema.   Mouth:  no gingival abnormalities and  pharynx pink and moist.   Neck:  supple and no masses.   Lungs:  normal respiratory effort and normal breath sounds.   Heart:  normal rate and regular rhythm.  , distant, no rub Abdomen:  soft, non-tender, and normal bowel sounds.   Msk:  left shoulder with tender to left subacromial area, worse pain to abduct Extremities:  no edema, no erythema  Skin:  left face near the left lateral eye with skin lesion c/w ? squamous cell;  nonrasied erythema nontender   Impression & Recommendations:  Problem # 1:  PLANTAR WART (ZOX-096.04) wth marked tender - for podiatry referral, pt educated adn reassured Orders: Podiatry Referral (Podiatry)  Problem # 2:  LESION, FACE (ICD-238.2)  refer derm - ? skin cancer  Orders: Dermatology Referral (Derma)  Problem # 3:  SHOULDER PAIN, LEFT (ICD-719.41)  His updated medication list for this problem includes:    Low-dose Aspirin 81 Mg Tabs (Aspirin) .Marland KitchenMarland KitchenMarland KitchenMarland Kitchen  One by mouth once daily    Tramadol-acetaminophen 37.5-325 Mg Tabs (Tramadol-acetaminophen) ..... One by mouth two times a day as needed  Orders: Orthopedic Surgeon Referral (Ortho Surgeon) by exam likely rot cuff tendonitis - to ortho for ? ned for cortisone shot  Problem # 4:  HYPERTENSION (ICD-401.9)  His updated medication list for this problem includes:    Lisinopril 10 Mg Tabs (Lisinopril) ..... One by mouth once daily  BP today: 110/62 Prior BP: 130/76 (05/12/2009)  Labs Reviewed: K+: 4.3 (05/12/2009) Creat: : 1.2 (05/12/2009)   Chol: 160 (06/04/2008)   HDL: 63.20 (06/04/2008)   LDL: 75 (06/04/2008)   TG: 108.0 (06/04/2008) stable overall by hx and exam, ok to continue meds/tx as is   Complete Medication List: 1)  Lisinopril 10 Mg Tabs (Lisinopril) .... One by mouth once daily 2)  Nexium 40 Mg Cpdr (Esomeprazole magnesium) .Marland Kitchen.. 1 by mouth once daily 3)  Low-dose Aspirin 81 Mg Tabs (Aspirin) .... One by mouth once daily 4)  Crestor 20 Mg Tabs (Rosuvastatin calcium) .Marland Kitchen.. 1 by mouth  once daily 5)  Nitroquick 0.4 Mg Subl (Nitroglycerin) .... One by mouth sl as needed 6)  Tramadol-acetaminophen 37.5-325 Mg Tabs (Tramadol-acetaminophen) .... One by mouth two times a day as needed 7)  Vitamin D 1000 Unit Tabs (Cholecalciferol) .Marland Kitchen.. 1 by mouth once daily 8)  Centrum Silver Tabs (Multiple vitamins-minerals) .Marland Kitchen.. 1 by mouth once daily 9)  Viagra 100 Mg Tabs (Sildenafil citrate) .Marland Kitchen.. 1 by mouth once daily as needed 10)  Claritin 10 Mg Tabs (Loratadine) .Marland Kitchen.. 1 by mouth once daily as needed 11)  Tens Unit Pad  .... Use asd  Other Orders: Flu Vaccine 53yrs + MEDICARE PATIENTS (Z6109) Administration Flu vaccine - MCR (U0454)  Patient Instructions: 1)  you had the flu shot today 2)  You will be contacted about the referral(s) to: dermatology, orthopedic and podiatry 3)  We will send the prescription for the TENS unit pad as you requested 4)  Continue all previous medications as before this visit 5)  Please schedule a follow-up appointment in 4 months ofr your "yearly medicare exam" Prescriptions: TENS UNIT PAD use asd  #4 x 11   Entered and Authorized by:   Corwin Levins MD   Signed by:   Corwin Levins MD on 09/10/2009   Method used:   Print then Give to Patient   RxID:   [id number]    Flu Vaccine Consent Questions     Do you have a history of severe allergic reactions to this vaccine? no    Any prior history of allergic reactions to egg and/or gelatin? no    Do you have a sensitivity to the preservative Thimersol? no    Do you have a past history of Guillan-Barre Syndrome? no    Do you currently have an acute febrile illness? no    Have you ever had a severe reaction to latex? no    Vaccine information given and explained to patient? yes    Are you currently pregnant? no    Lot Number:[id number]   Exp Date:07/03/2010   Site Given  Right Deltoid IMdflu

## 2010-02-04 NOTE — Progress Notes (Signed)
Summary: NUC PRE-PROCEDURE  Phone Note Outgoing Call   Call placed by: Domenic Polite, CNMT,  October 13, 2009 9:24 AM Call placed to: Patient Reason for Call: Confirm/change Appt Summary of Call: Reviewed information on [employer] Information Sheet (see scanned document for further details).  Spoke with patient Initial call taken by: Domenic Polite, CNMT,  October 13, 2009 9:24 AM     Nuclear Med Background Indications for Stress Test: Evaluation for Ischemia, Post Hospital  Indications Comments: [facility] ER DC'D 10/07/09  History: Echo, Heart Catheterization  History Comments: ECHO EF =55-60% ,'06 CATH 50%LAD  Symptoms: Diaphoresis  Symptoms Comments: ATYPICAL CHEST/BACK PAIN   Nuclear Pre-Procedure Cardiac Risk Factors: Hypertension, Lipids, PVD, RBBB Height (in): 68

## 2010-02-04 NOTE — Assessment & Plan Note (Signed)
Summary: SORE THROAT/CD   Vital Signs:  Patient profile:   77 year old male Height:      67 inches Weight:      134 pounds BMI:     21.06 O2 Sat:      97 % on Room air Temp:     98.1 degrees F oral Pulse rate:   85 / minute BP sitting:   110 / 70  (left arm) Cuff size:   regular  Vitals Entered By: Zella Ball Ewing CMA Duncan Dull) (December 24, 2009 10:59 AM)  O2 Flow:  Room air CC: Sore Throat and congestion/RE   Primary Care Provider:  Corwin Levins MD  CC:  Sore Throat and congestion/RE.  History of Present Illness: here with acute onset mild to mod 3 days fever, headache, ST and worsening prod cough greenish sputum with mild sob but Pt denies CP, worsening sob, doe, wheezing, orthopnea, pnd, worsening LE edema, palps, dizziness or syncope .  Pt denies new neuro symptoms such as headache, facial or extremity weakness  Pt denies polydipsia, polyuria  Overall good compliance with meds, trying to follow low chol diet, wt stable, little excercise however  No recent  wt loss, night sweats, loss of appetite or other constitutional symptoms  Does c/o pain to the right groin quite severe for several hours on getting up from lying position 3 wks ago, has not since recurred but he is worried about recurrent hernia, though he has not seen any swelling, no further pain even with standing or ambulation, and no GU symtpoms such as dysuria or freq or hematuria.  Problems Prior to Update: 1)  Groin Strain, Right  (ICD-848.8) 2)  Bronchitis-acute  (ICD-466.0) 3)  Shoulder Pain, Left  (ICD-719.41) 4)  Lesion, Face  (ICD-238.2) 5)  Plantar Wart  (ICD-078.12) 6)  Osteopenia  (ICD-733.90) 7)  Allergic Rhinitis  (ICD-477.9) 8)  Postural Lightheadedness  (ICD-780.4) 9)  Erectile Dysfunction, Organic  (ICD-607.84) 10)  Shoulder Pain, Bilateral  (ICD-719.41) 11)  Preventive Health Care  (ICD-V70.0) 12)  Groin Mass, Left  (ICD-789.39) 13)  Macular Degeneration, Bilateral  (ICD-362.50) 14)  Coronary Artery  Disease  (ICD-414.00) 15)  Peripheral Vascular Disease  (ICD-443.9) 16)  Hypertension  (ICD-401.9) 17)  Dyslipidemia  (ICD-272.4) 18)  Gerd  (ICD-530.81) 19)  Fatigue  (ICD-780.79) 20)  Vertigo  (ICD-780.4) 21)  Rash-nonvesicular  (ICD-782.1) 22)  Otitis Media, Acute, Left  (ICD-382.9) 23)  Abdominal Tenderness Left Lower Quadrant  (ICD-789.64) 24)  Swelling Mass or Lump in Head and Neck  (ICD-784.2) 25)  Osteoporosis  (ICD-733.00) 26)  Rectal Pain  (ICD-569.42) 27)  Back Pain  (ICD-724.5) 28)  Hip Pain, Chronic  (ICD-719.45) 29)  Erectile Dysfunction  (ICD-302.72) 30)  Bursitis, Left Shoulder  (ICD-726.10) 31)  Hx of Rotator Cuff Tear  (ICD-727.61) 32)  Spondylosis, Lumbar  (ICD-721.3)  Medications Prior to Update: 1)  Lisinopril 10 Mg  Tabs (Lisinopril) .... One By Mouth Once Daily 2)  Nexium 40 Mg  Cpdr (Esomeprazole Magnesium) .Marland Kitchen.. 1 By Mouth Once Daily 3)  Low-Dose Aspirin 81 Mg  Tabs (Aspirin) .... One By Mouth Once Daily 4)  Crestor 20 Mg  Tabs (Rosuvastatin Calcium) .Marland Kitchen.. 1 By Mouth Once Daily 5)  Nitroquick 0.4 Mg  Subl (Nitroglycerin) .... One By Mouth Sl As Needed 6)  Tramadol-Acetaminophen 37.5-325 Mg  Tabs (Tramadol-Acetaminophen) .... One By Mouth Two Times A Day As Needed 7)  Vitamin D 1000 Unit Tabs (Cholecalciferol) .Marland Kitchen.. 1 By Mouth Once Daily  8)  Skelaxin 800 Mg Tabs (Metaxalone) .... As Needed  Current Medications (verified): 1)  Lisinopril 10 Mg  Tabs (Lisinopril) .... One By Mouth Once Daily 2)  Nexium 40 Mg  Cpdr (Esomeprazole Magnesium) .Marland Kitchen.. 1 By Mouth Once Daily 3)  Low-Dose Aspirin 81 Mg  Tabs (Aspirin) .... One By Mouth Once Daily 4)  Crestor 20 Mg  Tabs (Rosuvastatin Calcium) .Marland Kitchen.. 1 By Mouth Once Daily 5)  Nitroquick 0.4 Mg  Subl (Nitroglycerin) .... One By Mouth Sl As Needed 6)  Tramadol-Acetaminophen 37.5-325 Mg  Tabs (Tramadol-Acetaminophen) .... One By Mouth Two Times A Day As Needed 7)  Vitamin D 1000 Unit Tabs (Cholecalciferol) .Marland Kitchen.. 1 By Mouth Once  Daily 8)  Skelaxin 800 Mg Tabs (Metaxalone) .... As Needed 9)  Levofloxacin 500 Mg Tabs (Levofloxacin) .Marland Kitchen.. 1po Once Daily 10)  Hydrocodone-Homatropine 5-1.5 Mg/72ml Syrp (Hydrocodone-Homatropine) .Marland Kitchen.. 1 Tsp By Mouth Q 6 Hrs As Needed Cough  Allergies (verified): 1)  ! Protonix (Pantoprazole Sodium) 2)  ! Aciphex (Rabeprazole Sodium) 3)  ! Biaxin 4)  * Cialis  Past History:  Past Medical History: Last updated: 09/10/2009 Current Problems:  CORONARY ARTERY DISEASE (ICD-414.00)-Nonobstructive PERIPHERAL VASCULAR DISEASE (ICD-443.9) HYPERTENSION (ICD-401.9) DYSLIPIDEMIA (ICD-272.4) GERD (ICD-530.81) FATIGUE (ICD-780.79) VERTIGO (ICD-780.4) RASH-NONVESICULAR (ICD-782.1) OTITIS MEDIA, ACUTE, LEFT (ICD-382.9) ABDOMINAL TENDERNESS LEFT LOWER QUADRANT (ICD-789.64) SWELLING MASS OR LUMP IN HEAD AND NECK (ICD-784.2) OSTEOPOROSIS (ICD-733.00) RECTAL PAIN (ICD-569.42) BACK PAIN (ICD-724.5) HIP PAIN, CHRONIC (ICD-719.45) ERECTILE DYSFUNCTION (ICD-302.72) BURSITIS, LEFT SHOULDER (ICD-726.10) Hx of ROTATOR CUFF TEAR (ICD-727.61) SPONDYLOSIS, LUMBAR (ICD-721.3) macular degeneration  - dr Hazle Quant E.D. Allergic rhinitis Osteopenia  Past Surgical History: Last updated: 12/04/2008 Cataract extraction - bilat Tonsillectomy Inguinal herniorrhaphy - bilat  Social History: Last updated: 12/04/2008 Retired Never Smoked Alcohol use-no He has been married for the last 57 years.  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   Risk Factors: Smoking Status: never (01/15/2007)  Review of Systems       all otherwise negative per pt -    Physical Exam  General:  alert and well-developed.  , mild ill  Head:  normocephalic and atraumatic.   Eyes:  vision grossly intact, pupils equal, and pupils round.   Ears:  left TM mild erythema, right TM ok, canals clear Nose:  no external deformity and no nasal discharge.     Mouth:  no gingival abnormalities and pharyngeal erythema.   Neck:  supple and no masses.   Lungs:  normal respiratory effort.  , no wheezes, but as few LLL crackles Heart:  normal rate and regular rhythm.   Genitalia:  Testes bilaterally descended without nodularity, tenderness or masses. No scrotal masses or lesions. No penis lesions or urethral discharge. No inguinal hernia noted Msk:  right hip somewhat "stiff" on ROM but no pain elicited Extremities:  no edema, no erythema    Impression & Recommendations:  Problem # 1:  BRONCHITIS-ACUTE (ICD-466.0)  with prod cough and mild sob/doe, few LLL rales - cant r/o pna  - for cxr, treat as above, f/u any worsening signs or symptoms   His updated medication list for this problem includes:    Levofloxacin 500 Mg Tabs (Levofloxacin) .Marland Kitchen... 1po once daily    Hydrocodone-homatropine 5-1.5 Mg/80ml Syrp (Hydrocodone-homatropine) .Marland Kitchen... 1 tsp by mouth q 6 hrs as needed cough  Orders: T-2 View CXR, Same Day (71020.5TC)  Problem # 2:  GROIN STRAIN, RIGHT (ICD-848.8) x 3  wks, overal  improved, exam bening, no recurrent hernia  Problem # 3:  HYPERTENSION (ICD-401.9)  His updated medication list for this problem includes:    Lisinopril 10 Mg Tabs (Lisinopril) ..... One by mouth once daily  BP today: 110/70 Prior BP: 104/60 (10/19/2009)  Labs Reviewed: K+: 4.3 (05/12/2009) Creat: : 1.2 (05/12/2009)   Chol: 160 (06/04/2008)   HDL: 63.20 (06/04/2008)   LDL: 75 (06/04/2008)   TG: 108.0 (06/04/2008) stable overall by hx and exam, ok to continue meds/tx as is   Complete Medication List: 1)  Lisinopril 10 Mg Tabs (Lisinopril) .... One by mouth once daily 2)  Nexium 40 Mg Cpdr (Esomeprazole magnesium) .Marland Kitchen.. 1 by mouth once daily 3)  Low-dose Aspirin 81 Mg Tabs (Aspirin) .... One by mouth once daily 4)  Crestor 20 Mg Tabs (Rosuvastatin calcium) .Marland Kitchen.. 1 by mouth once daily 5)  Nitroquick 0.4 Mg Subl (Nitroglycerin) .... One by mouth sl as needed 6)   Tramadol-acetaminophen 37.5-325 Mg Tabs (Tramadol-acetaminophen) .... One by mouth two times a day as needed 7)  Vitamin D 1000 Unit Tabs (Cholecalciferol) .Marland Kitchen.. 1 by mouth once daily 8)  Skelaxin 800 Mg Tabs (Metaxalone) .... As needed 9)  Levofloxacin 500 Mg Tabs (Levofloxacin) .Marland Kitchen.. 1po once daily 10)  Hydrocodone-homatropine 5-1.5 Mg/73ml Syrp (Hydrocodone-homatropine) .Marland Kitchen.. 1 tsp by mouth q 6 hrs as needed cough  Patient Instructions: 1)  Please take all new medications as prescribed 2)  Continue all previous medications as before this visit  3)  Please go to Radiology in the basement level for your X-Ray today  4)  Please call the number on the [facility] Card for results of your testing 5)  Please schedule a follow-up appointment in January 2012 as planned Prescriptions: HYDROCODONE-HOMATROPINE 5-1.5 MG/5ML SYRP (HYDROCODONE-HOMATROPINE) 1 tsp by mouth q 6 hrs as needed cough  #6oz x 1   Entered and Authorized by:   Corwin Levins MD   Signed by:   Corwin Levins MD on 12/24/2009   Method used:   Print then Give to Patient   RxID:   [id number] LEVOFLOXACIN 500 MG TABS (LEVOFLOXACIN) 1po once daily  #10 x 0   Entered and Authorized by:   Corwin Levins MD   Signed by:   Corwin Levins MD on 12/24/2009   Method used:   Electronically to        [facility] [address] [phone]* (retail)       [address]       [place], [place]  [address]       Ph: [phone] or [phone]       Fax: [phone]   RxID:   [id number] CRESTOR 20 MG  TABS (ROSUVASTATIN CALCIUM) 1 by mouth once daily  #90 x 3   Entered and Authorized by:   Corwin Levins MD   Signed by:   Corwin Levins MD on 12/24/2009   Method used:   Electronically to        [facility]. [address] [phone]* (retail)       [address]       [place], [place]  [address]       Ph: [phone] or [phone]       Fax: [phone]   RxID:   [id number]    Orders Added: 1)  T-2 View CXR, Same Day  [71020.5TC] 2)  Est. Patient Level IV [id number]

## 2010-02-04 NOTE — Assessment & Plan Note (Signed)
Summary: rov/post myoview   Visit Type:  Follow-up Primary Provider:  Corwin Levins MD  CC:  Post-myoview.  History of Present Illness: 77 year-old male with history of moderate nonobstructive CAD presenting for ER followup evaluation. He awoke from sleep with severe upper back pain and some pain into his chest. A CT angiogram was performed and this was negative for PE or dissection. He ruled out for MI and was released wlith an outpatient Myoview arranged for evaluationm of myocardial ischemia...this was negative.  He has had no further chest or back pain. He has been active without exertional symptoms. Denies edema, lightheadedness, syncope, or other complaints.  Current Medications (verified): 1)  Lisinopril 10 Mg  Tabs (Lisinopril) .... One By Mouth Once Daily 2)  Nexium 40 Mg  Cpdr (Esomeprazole Magnesium) .Marland Kitchen.. 1 By Mouth Once Daily 3)  Low-Dose Aspirin 81 Mg  Tabs (Aspirin) .... One By Mouth Once Daily 4)  Crestor 20 Mg  Tabs (Rosuvastatin Calcium) .Marland Kitchen.. 1 By Mouth Once Daily 5)  Nitroquick 0.4 Mg  Subl (Nitroglycerin) .... One By Mouth Sl As Needed 6)  Tramadol-Acetaminophen 37.5-325 Mg  Tabs (Tramadol-Acetaminophen) .... One By Mouth Two Times A Day As Needed 7)  Vitamin D 1000 Unit Tabs (Cholecalciferol) .Marland Kitchen.. 1 By Mouth Once Daily 8)  Skelaxin 800 Mg Tabs (Metaxalone) .... As Needed  Allergies: 1)  ! Protonix (Pantoprazole Sodium) 2)  ! Aciphex (Rabeprazole Sodium) 3)  ! Biaxin 4)  * Cialis  Past History:  Past medical history reviewed for relevance to current acute and chronic problems.  Past Medical History: Reviewed history from 09/10/2009 and no changes required. Current Problems:  CORONARY ARTERY DISEASE (ICD-414.00)-Nonobstructive PERIPHERAL VASCULAR DISEASE (ICD-443.9) HYPERTENSION (ICD-401.9) DYSLIPIDEMIA (ICD-272.4) GERD (ICD-530.81) FATIGUE (ICD-780.79) VERTIGO (ICD-780.4) RASH-NONVESICULAR (ICD-782.1) OTITIS MEDIA, ACUTE, LEFT (ICD-382.9) ABDOMINAL  TENDERNESS LEFT LOWER QUADRANT (ICD-789.64) SWELLING MASS OR LUMP IN HEAD AND NECK (ICD-784.2) OSTEOPOROSIS (ICD-733.00) RECTAL PAIN (ICD-569.42) BACK PAIN (ICD-724.5) HIP PAIN, CHRONIC (ICD-719.45) ERECTILE DYSFUNCTION (ICD-302.72) BURSITIS, LEFT SHOULDER (ICD-726.10) Hx of ROTATOR CUFF TEAR (ICD-727.61) SPONDYLOSIS, LUMBAR (ICD-721.3) macular degeneration  - dr [name] [name] E.D. Allergic rhinitis Osteopenia  Review of Systems       Negative except as per HPI   Vital Signs:  Patient profile:   77 year old male Height:      68 inches Weight:      131.50 pounds BMI:     20.07 Pulse rate:   76 / minute Pulse rhythm:   regular Resp:     18 per minute BP sitting:   104 / 60  (left arm) Cuff size:   large  Vitals Entered By: [name] [name] (October 19, 2009 12:40 PM)  Physical Exam  General:  Pt is alert and oriented, elderly male in no acute distress. HEENT: normal Neck: normal carotid upstrokes without bruits, JVP normal Lungs: CTA CV: RRR without murmur or gallop Abd: soft, NT, positive BS, no bruit, no organomegaly Ext: no clubbing, cyanosis, or edema. peripheral pulses 2+ and equal Skin: warm and dry without rash    Nuclear ETT  Procedure date:  10/14/2009  Findings:      Overall Impression   Exercise Capacity: Fair exercise capacity. BP Response: Normal blood pressure response. Clinical Symptoms: Leg Fatigue ECG Impression: No significant ST segment change suggestive of ischemia. Overall Impression: Normal stress nuclear study.   Impression & Recommendations:  Problem # 1:  CORONARY ARTERY DISEASE (ICD-414.00) Normal Myoview reassuring, as is the fact that symptoms have not recurred. Discussed in  detail with the patient and his wife who are both quite concerned. Also discussed CT finding and potential need for serial followup of pulmonary nodules (will defer to Dr [name] [name] on this). Continue current CV meds without changes.  His updated medication list for this  problem includes:    Lisinopril 10 Mg Tabs (Lisinopril) ..... One by mouth once daily    Low-dose Aspirin 81 Mg Tabs (Aspirin) ..... One by mouth once daily    Nitroquick 0.4 Mg Subl (Nitroglycerin) ..... One by mouth sl as needed  Problem # 2:  HYPERTENSION (ICD-401.9) well-controlled.  His updated medication list for this problem includes:    Lisinopril 10 Mg Tabs (Lisinopril) ..... One by mouth once daily    Low-dose Aspirin 81 Mg Tabs (Aspirin) ..... One by mouth once daily  BP today: 104/60 Prior BP: 110/62 (09/10/2009)  Labs Reviewed: K+: 4.3 (05/12/2009) Creat: : 1.2 (05/12/2009)   Chol: 160 (06/04/2008)   HDL: 63.20 (06/04/2008)   LDL: 75 (06/04/2008)   TG: 108.0 (06/04/2008)  Problem # 3:  DYSLIPIDEMIA (ICD-272.4) Lipids at goal.  His updated medication list for this problem includes:    Crestor 20 Mg Tabs (Rosuvastatin calcium) .Marland Kitchen... 1 by mouth once daily  CHOL: 160 (06/04/2008)   LDL: 75 (06/04/2008)   HDL: 63.20 (06/04/2008)   TG: 108.0 (06/04/2008)  Patient Instructions: 1)  Your physician recommends that you schedule a follow-up appointment in: 1 year 2)  Your physician recommends that you continue on your current medications as directed. Please refer to the Current Medication list given to you today.

## 2010-02-04 NOTE — Assessment & Plan Note (Signed)
Summary: [name] GETS DIZZY WHEN [name] [name]/ [name] [name]/[name] [name]   Vital Signs:  Patient profile:   77 year old male Height:      68 inches Weight:      132.75 pounds BMI:     20.26 O2 Sat:      96 % on Room air Temp:     98.2 degrees F oral Pulse rate:   86 / minute BP supine:   122 / 72 BP sitting:   122 / 74  (left arm) BP standing:   130 / 76 Cuff size:   regular  Vitals Entered ByZella Ball Ewing (May 12, 2009 3:06 PM)  O2 Flow:  Room air CC: Dizzy, for 10 days/RE   Primary Care Provider:  Corwin Levins MD  CC:  Dizzy and for 10 days/RE.  History of Present Illness: here with primarily increased positional vertigo for the last 10 days, assoc with increased nasal allergy symptoms with post nasal gtt and mild hearing loss, but no headache, fever, pain, n/v/d, abd pain.  Pt denies CP, sob, doe, wheezing, orthopnea, pnd, worsening LE edema, palps, dizziness or syncope   Pt denies new neuro symptoms such as headache, facial or extremity weakness   Also c/o a lgihtheadedness as well, particularly with bending forward and then raising [name] to stand up again.  Recent carotids ok; no frank dizziness o/w and is not orthostatic today.  Overall good compliance with meds,  good tolerability.    Also c/o incidental lower thoracic area crampy pain yesterday , severe for approx 20 sec that was debilitating for that time, but not assoc with CP, SOB, cough, fever , other bowel or bladder change, or LE pain, weakness, numbness, recent wt loss or night sweats.  Took one sl NTG due to fear of somehow being related to his heart , but pain was resolved at the time he took it, and pain has not recurred.  No trauma or fall.    Problems Prior to Update: 1)  Allergic Rhinitis  (ICD-477.9) 2)  Postural Lightheadedness  (ICD-780.4) 3)  Erectile Dysfunction, Organic  (ICD-607.84) 4)  Shoulder Pain, Bilateral  (ICD-719.41) 5)  Preventive Health Care  (ICD-V70.0) 6)  Groin Mass, Left  (ICD-789.39) 7)  Macular  Degeneration, Bilateral  (ICD-362.50) 8)  Coronary Artery Disease  (ICD-414.00) 9)  Peripheral Vascular Disease  (ICD-443.9) 10)  Hypertension  (ICD-401.9) 11)  Dyslipidemia  (ICD-272.4) 12)  Gerd  (ICD-530.81) 13)  Fatigue  (ICD-780.79) 14)  Vertigo  (ICD-780.4) 15)  Rash-nonvesicular  (ICD-782.1) 16)  Otitis Media, Acute, Left  (ICD-382.9) 17)  Abdominal Tenderness Left Lower Quadrant  (ICD-789.64) 18)  Swelling Mass or Lump in Head and Neck  (ICD-784.2) 19)  Osteoporosis  (ICD-733.00) 20)  Rectal Pain  (ICD-569.42) 21)  [name] Pain  (ICD-724.5) 22)  Hip Pain, Chronic  (ICD-719.45) 23)  Erectile Dysfunction  (ICD-302.72) 24)  Bursitis, Left Shoulder  (ICD-726.10) 25)  Hx of Rotator Cuff Tear  (ICD-727.61) 26)  Spondylosis, Lumbar  (ICD-721.3)  Medications Prior to Update: 1)  Lisinopril 10 Mg  Tabs (Lisinopril) .... One By Mouth Once Daily 2)  Nexium 40 Mg  Cpdr (Esomeprazole Magnesium) .Marland Kitchen.. 1 By Mouth Once Daily 3)  Low-Dose Aspirin 81 Mg  Tabs (Aspirin) .... One By Mouth Once Daily 4)  Crestor 20 Mg  Tabs (Rosuvastatin Calcium) .Marland Kitchen.. 1 By Mouth Once Daily 5)  Nitroquick 0.4 Mg  Subl (Nitroglycerin) .... One By Mouth Sl As Needed 6)  Tramadol-Acetaminophen 37.5-325 Mg  Tabs (Tramadol-Acetaminophen) .... One By Mouth Two Times A Day As Needed 7)  Vitamin D 1000 Unit Tabs (Cholecalciferol) .Marland Kitchen.. 1 By Mouth Once Daily 8)  Centrum Silver  Tabs (Multiple Vitamins-Minerals) .Marland Kitchen.. 1 By Mouth Once Daily 9)  Viagra 100 Mg Tabs (Sildenafil Citrate) .Marland Kitchen.. 1 By Mouth Once Daily As Needed  Current Medications (verified): 1)  Lisinopril 10 Mg  Tabs (Lisinopril) .... One By Mouth Once Daily 2)  Nexium 40 Mg  Cpdr (Esomeprazole Magnesium) .Marland Kitchen.. 1 By Mouth Once Daily 3)  Low-Dose Aspirin 81 Mg  Tabs (Aspirin) .... One By Mouth Once Daily 4)  Crestor 20 Mg  Tabs (Rosuvastatin Calcium) .Marland Kitchen.. 1 By Mouth Once Daily 5)  Nitroquick 0.4 Mg  Subl (Nitroglycerin) .... One By Mouth Sl As Needed 6)   Tramadol-Acetaminophen 37.5-325 Mg  Tabs (Tramadol-Acetaminophen) .... One By Mouth Two Times A Day As Needed 7)  Vitamin D 1000 Unit Tabs (Cholecalciferol) .Marland Kitchen.. 1 By Mouth Once Daily 8)  Centrum Silver  Tabs (Multiple Vitamins-Minerals) .Marland Kitchen.. 1 By Mouth Once Daily 9)  Viagra 100 Mg Tabs (Sildenafil Citrate) .Marland Kitchen.. 1 By Mouth Once Daily As Needed 10)  Claritin 10 Mg Tabs (Loratadine) .Marland Kitchen.. 1 By Mouth Once Daily As Needed  Allergies (verified): 1)  ! Protonix (Pantoprazole Sodium) 2)  ! Aciphex (Rabeprazole Sodium) 3)  ! Biaxin 4)  * Cialis  Past History:  Past Surgical History: Last updated: 12/04/2008 Cataract extraction - bilat Tonsillectomy Inguinal herniorrhaphy - bilat  Social History: Last updated: 12/04/2008 Retired Never Smoked Alcohol use-no He has been married for the last 57 years.  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   Risk Factors: Smoking Status: never (01/15/2007)  Past Medical History: Current Problems:  CORONARY ARTERY DISEASE (ICD-414.00)-Nonobstructive PERIPHERAL VASCULAR DISEASE (ICD-443.9) HYPERTENSION (ICD-401.9) DYSLIPIDEMIA (ICD-272.4) GERD (ICD-530.81) FATIGUE (ICD-780.79) VERTIGO (ICD-780.4) RASH-NONVESICULAR (ICD-782.1) OTITIS MEDIA, ACUTE, LEFT (ICD-382.9) ABDOMINAL TENDERNESS LEFT LOWER QUADRANT (ICD-789.64) SWELLING MASS OR LUMP IN HEAD AND NECK (ICD-784.2) OSTEOPOROSIS (ICD-733.00) RECTAL PAIN (ICD-569.42) [name] PAIN (ICD-724.5) HIP PAIN, CHRONIC (ICD-719.45) ERECTILE DYSFUNCTION (ICD-302.72) BURSITIS, LEFT SHOULDER (ICD-726.10) Hx of ROTATOR CUFF TEAR (ICD-727.61) SPONDYLOSIS, LUMBAR (ICD-721.3) macular degeneration  - dr Hazle Quant E.D. Allergic rhinitis  Review of Systems       all otherwise negative per pt -    Physical Exam  General:  alert and well-developed.   Head:  normocephalic and atraumatic.   Eyes:  vision grossly intact, pupils equal,  and pupils round.   Ears:  bilat tm's mild red, sinus nontender Nose:  nasal dischargemucosal pallor and mucosal edema.   Mouth:  no gingival abnormalities and pharynx pink and moist.   Neck:  supple and no masses.   Lungs:  normal respiratory effort and normal breath sounds.   Heart:  normal rate and regular rhythm.  , distant, no rub Abdomen:  soft, non-tender, and normal bowel sounds.   Msk:  no joint tenderness and no joint swelling.  ; no spine tender and no paraspinal tender or spasm Extremities:  no edema, no erythema  Neurologic:  alert & oriented X3, cranial nerves II-XII intact, strength normal in all extremities, gait normal, and finger-to-nose normal.  ; c/o vertigo with lying flat during exam Skin:  color normal and no rashes.   Psych:  not depressed appearing and slightly anxious.     Impression & Recommendations:  Problem # 1:  VERTIGO (ICD-780.4)  suspect inner ear  related, ? due to allergies, to cont meclizine as needed   His updated medication list for this problem includes:    Claritin 10 Mg Tabs (Loratadine) .Marland Kitchen... 1 by mouth once daily as needed  Problem # 2:  POSTURAL LIGHTHEADEDNESS (ICD-780.4)  ? clinical significance - to check labs, and echo, cxr   Orders: Echo Referral (Echo) EKG w/ Interpretation (93000) T-2 View CXR, Same Day (71020.5TC) TLB-BMP (Basic Metabolic Panel-BMET) (80048-METABOL) TLB-CBC Platelet - w/Differential (85025-CBCD)  His updated medication list for this problem includes:    Claritin 10 Mg Tabs (Loratadine) .Marland Kitchen... 1 by mouth once daily as needed  Problem # 3:  [name] PAIN (ICD-724.5)  His updated medication list for this problem includes:    Low-dose Aspirin 81 Mg Tabs (Aspirin) ..... One by mouth once daily    Tramadol-acetaminophen 37.5-325 Mg Tabs (Tramadol-acetaminophen) ..... One by mouth two times a day as needed suspect msk spasm, exam benign; to check [name] films, tylenol as needed   Orders: T-Thoracic Spine 2 Views  [phone])  Problem # 4:  ALLERGIC RHINITIS (ICD-477.9)  for claritin OTC as needed   His updated medication list for this problem includes:    Claritin 10 Mg Tabs (Loratadine) .Marland Kitchen... 1 by mouth once daily as needed  Complete Medication List: 1)  Lisinopril 10 Mg Tabs (Lisinopril) .... One by mouth once daily 2)  Nexium 40 Mg Cpdr (Esomeprazole magnesium) .Marland Kitchen.. 1 by mouth once daily 3)  Low-dose Aspirin 81 Mg Tabs (Aspirin) .... One by mouth once daily 4)  Crestor 20 Mg Tabs (Rosuvastatin calcium) .Marland Kitchen.. 1 by mouth once daily 5)  Nitroquick 0.4 Mg Subl (Nitroglycerin) .... One by mouth sl as needed 6)  Tramadol-acetaminophen 37.5-325 Mg Tabs (Tramadol-acetaminophen) .... One by mouth two times a day as needed 7)  Vitamin D 1000 Unit Tabs (Cholecalciferol) .Marland Kitchen.. 1 by mouth once daily 8)  Centrum Silver Tabs (Multiple vitamins-minerals) .Marland Kitchen.. 1 by mouth once daily 9)  Viagra 100 Mg Tabs (Sildenafil citrate) .Marland Kitchen.. 1 by mouth once daily as needed 10)  Claritin 10 Mg Tabs (Loratadine) .Marland Kitchen.. 1 by mouth once daily as needed  Patient Instructions: 1)  Please go to the Lab in the basement for your blood and/or urine tests today 2)  Please go to Radiology in the basement level for your X-Ray today 3)  You will be contacted about the referral(s) to: echocardiogram for the heart 4)  Continue all previous medications as before this visit  5)  please take Claritin OTC for the allergies (may help the ears and the dizziness as well) 6)  Please schedule a follow-up appointment in 4 months or sooner if needed

## 2010-02-04 NOTE — Progress Notes (Signed)
Summary: RESULTS  Phone Note Call from Patient Call back at Home Phone [phone]   Summary of Call: Patient is requesting results of ECHO.  Initial call taken by: Lamar Sprinkles, CMA,  May 25, 2009 10:17 AM  Follow-up for Phone Call        pt informed of Mild Dystolic Dysfunction Follow-up by: Margaret Pyle, CMA,  May 25, 2009 10:30 AM

## 2010-02-10 NOTE — Assessment & Plan Note (Signed)
Summary: yearly medicare exam/will come fasting/cd   Vital Signs:  Patient profile:   77 year old male Height:      67 inches Weight:      132 pounds BMI:     20.75 O2 Sat:      99 % on Room air Temp:     97.5 degrees F oral Pulse rate:   73 / minute BP sitting:   126 / 72  (left arm) Cuff size:   regular  Vitals Entered By: Zella Ball Ewing CMA Duncan Dull) (January 25, 2010 10:19 AM)  O2 Flow:  Room air  CC: Yearly Medicare/RE   Primary Care Provider:  Corwin Levins MD  CC:  Yearly Medicare/RE.  History of Present Illness: here for f/u;  remembers he had a pulm nodule due for f/u;  Pt denies CP, worsening sob, doe, wheezing, orthopnea, pnd, worsening LE edema, palps, dizziness or syncope  Pt denies new neuro symptoms such as headache, facial or extremity weakness  Pt denies polydipsia, polyuria   Overall good compliance with meds, trying to follow low chol diet, wt stable, little excercise however .  No fever, wt loss, night sweats, loss of appetite or other constitutional symptoms  Overall good compliance with meds, and good tolerability. Denies worsening depressive symptoms, suicidal ideation, or panic.    Does have ongoing mild fatigue, wtihout daytime somnolence.  Sleeps ok at night.   No recent falls or broken bones;  Pt states good ability with ADL's, low fall risk, home safety reviewed and adequate, no significant change in hearing or vision.    Problems Prior to Update: 1)  Fatigue  (ICD-780.79) 2)  Lung Nodule  (ICD-518.89) 3)  Groin Strain, Right  (ICD-848.8) 4)  Shoulder Pain, Left  (ICD-719.41) 5)  Lesion, Face  (ICD-238.2) 6)  Plantar Wart  (ICD-078.12) 7)  Osteopenia  (ICD-733.90) 8)  Allergic Rhinitis  (ICD-477.9) 9)  Postural Lightheadedness  (ICD-780.4) 10)  Erectile Dysfunction, Organic  (ICD-607.84) 11)  Shoulder Pain, Bilateral  (ICD-719.41) 12)  Preventive Health Care  (ICD-V70.0) 13)  Groin Mass, Left  (ICD-789.39) 14)  Macular Degeneration, Bilateral   (ICD-362.50) 15)  Coronary Artery Disease  (ICD-414.00) 16)  Peripheral Vascular Disease  (ICD-443.9) 17)  Hypertension  (ICD-401.9) 18)  Dyslipidemia  (ICD-272.4) 19)  Gerd  (ICD-530.81) 20)  Fatigue  (ICD-780.79) 21)  Vertigo  (ICD-780.4) 22)  Rash-nonvesicular  (ICD-782.1) 23)  Otitis Media, Acute, Left  (ICD-382.9) 24)  Abdominal Tenderness Left Lower Quadrant  (ICD-789.64) 25)  Swelling Mass or Lump in Head and Neck  (ICD-784.2) 26)  Osteoporosis  (ICD-733.00) 27)  Rectal Pain  (ICD-569.42) 28)  Back Pain  (ICD-724.5) 29)  Hip Pain, Chronic  (ICD-719.45) 30)  Erectile Dysfunction  (ICD-302.72) 31)  Bursitis, Left Shoulder  (ICD-726.10) 32)  Hx of Rotator Cuff Tear  (ICD-727.61) 33)  Spondylosis, Lumbar  (ICD-721.3)  Medications Prior to Update: 1)  Lisinopril 10 Mg  Tabs (Lisinopril) .... One By Mouth Once Daily 2)  Nexium 40 Mg  Cpdr (Esomeprazole Magnesium) .Marland Kitchen.. 1 By Mouth Once Daily 3)  Low-Dose Aspirin 81 Mg  Tabs (Aspirin) .... One By Mouth Once Daily 4)  Crestor 20 Mg  Tabs (Rosuvastatin Calcium) .Marland Kitchen.. 1 By Mouth Once Daily 5)  Nitroquick 0.4 Mg  Subl (Nitroglycerin) .... One By Mouth Sl As Needed 6)  Tramadol-Acetaminophen 37.5-325 Mg  Tabs (Tramadol-Acetaminophen) .... One By Mouth Two Times A Day As Needed 7)  Vitamin D 1000 Unit Tabs (Cholecalciferol) .Marland KitchenMarland KitchenMarland Kitchen 1  By Mouth Once Daily 8)  Skelaxin 800 Mg Tabs (Metaxalone) .... As Needed 9)  Levofloxacin 500 Mg Tabs (Levofloxacin) .Marland Kitchen.. 1po Once Daily 10)  Hydrocodone-Homatropine 5-1.5 Mg/20ml Syrp (Hydrocodone-Homatropine) .Marland Kitchen.. 1 Tsp By Mouth Q 6 Hrs As Needed Cough  Current Medications (verified): 1)  Lisinopril 10 Mg  Tabs (Lisinopril) .... One By Mouth Once Daily 2)  Nexium 40 Mg  Cpdr (Esomeprazole Magnesium) .Marland Kitchen.. 1 By Mouth Once Daily 3)  Low-Dose Aspirin 81 Mg  Tabs (Aspirin) .... One By Mouth Once Daily 4)  Crestor 20 Mg  Tabs (Rosuvastatin Calcium) .Marland Kitchen.. 1 By Mouth Once Daily 5)  Nitroquick 0.4 Mg  Subl  (Nitroglycerin) .... One By Mouth Sl As Needed 6)  Tramadol-Acetaminophen 37.5-325 Mg  Tabs (Tramadol-Acetaminophen) .... One By Mouth Two Times A Day As Needed 7)  Vitamin D 1000 Unit Tabs (Cholecalciferol) .Marland Kitchen.. 1 By Mouth Once Daily 8)  Skelaxin 800 Mg Tabs (Metaxalone) .... As Needed 9)  Viagra 100 Mg Tabs (Sildenafil Citrate) .Marland Kitchen.. 1 By Mouth Every Other Day As Needed 10)  Alendronate Sodium 70 Mg Tabs (Alendronate Sodium) .Marland Kitchen.. 1 By Mouth Q Wk  Allergies (verified): 1)  ! Protonix (Pantoprazole Sodium) 2)  ! Aciphex (Rabeprazole Sodium) 3)  ! Biaxin 4)  * Cialis  Past History:  Past Surgical History: Last updated: 12/04/2008 Cataract extraction - bilat Tonsillectomy Inguinal herniorrhaphy - bilat  Family History: Last updated: 12/04/2008 Notable for heart disease in his father who passed away at age 12. Mother also noted to have coronary artery disease, deceasedat age 70. Also, has a brother that died at age 36 secondary to coronaryartery disease.  sister died 18-Apr-2022 - renal failure  Social History: Last updated: 12/04/2008 Retired Never Smoked Alcohol use-no He has been married for the last 57 years.  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   Risk Factors: Smoking Status: never (01/15/2007)  Past Medical History: Current Problems:  CORONARY ARTERY DISEASE (ICD-414.00)-Nonobstructive PERIPHERAL VASCULAR DISEASE (ICD-443.9) HYPERTENSION (ICD-401.9) DYSLIPIDEMIA (ICD-272.4) GERD (ICD-530.81) FATIGUE (ICD-780.79) VERTIGO (ICD-780.4) RASH-NONVESICULAR (ICD-782.1) OTITIS MEDIA, ACUTE, LEFT (ICD-382.9) ABDOMINAL TENDERNESS LEFT LOWER QUADRANT (ICD-789.64) SWELLING MASS OR LUMP IN HEAD AND NECK (ICD-784.2) OSTEOPOROSIS (ICD-733.00) RECTAL PAIN (ICD-569.42) BACK PAIN (ICD-724.5) HIP PAIN, CHRONIC (ICD-719.45) ERECTILE DYSFUNCTION (ICD-302.72) BURSITIS, LEFT SHOULDER (ICD-726.10) Hx of  ROTATOR CUFF TEAR (ICD-727.61) SPONDYLOSIS, LUMBAR (ICD-721.3) macular degeneration  - dr Hazle Quant E.D. Allergic rhinitis Osteopenia Osteoporosis  Review of Systems       all otherwise negative per pt -  except for perirectl irriaqtion better with prep H;  also pt declines PSA today due to age  Physical Exam  General:  alert and well-developed.   Head:  normocephalic and atraumatic.   Eyes:  vision grossly intact, pupils equal, and pupils round.   Ears:  R ear normal and L ear normal.   Nose:  no external deformity and no nasal discharge.   Mouth:  no gingival abnormalities and pharynx pink and moist.   Neck:  supple and no masses.   Lungs:  normal respiratory effort and normal breath sounds.   Heart:  normal rate and regular rhythm.   Abdomen:  soft, non-tender, and normal bowel sounds.   Msk:  no joint tenderness and no joint swelling.   Extremities:  no edema, no erythema  Neurologic:  strength normal in all extremities and gait normal.   Skin:  color normal and no rashes.  Psych:  not anxious appearing and not depressed appearing.     Impression & Recommendations:  Problem # 1:  LUNG NODULE (ICD-518.89) pre CT angio oct 2011;  will plan f/u CT for oct 2012; o/w stable overall by hx and exam, ok to contineu as is; no pulm or constitutional symtpoms today  Problem # 2:  HYPERTENSION (ICD-401.9)  His updated medication list for this problem includes:    Lisinopril 10 Mg Tabs (Lisinopril) ..... One by mouth once daily  BP today: 126/72 Prior BP: 110/70 (12/24/2009)  Labs Reviewed: K+: 4.3 (05/12/2009) Creat: : 1.2 (05/12/2009)   Chol: 160 (06/04/2008)   HDL: 63.20 (06/04/2008)   LDL: 75 (06/04/2008)   TG: 108.0 (06/04/2008) stable overall by hx and exam, ok to continue meds/tx as is   Problem # 3:  OSTEOPOROSIS (ICD-733.00)  Orders: T-Bone Densitometry (16109)  His updated medication list for this problem includes:    Alendronate Sodium 70 Mg Tabs (Alendronate  sodium) .Marland Kitchen... 1 by mouth q wk due for dxa- for f/u exam, Continue all previous medications as before this visit   Problem # 4:  FATIGUE (ICD-780.79) exam benign, to check labs below; follow with expectant management  Orders: TLB-BMP (Basic Metabolic Panel-BMET) (80048-METABOL) TLB-CBC Platelet - w/Differential (85025-CBCD) TLB-Hepatic/Liver Function Pnl (80076-HEPATIC) TLB-TSH (Thyroid Stimulating Hormone) (84443-TSH)  Problem # 5:  DYSLIPIDEMIA (ICD-272.4)  His updated medication list for this problem includes:    Crestor 20 Mg Tabs (Rosuvastatin calcium) .Marland Kitchen... 1 by mouth once daily  Orders: TLB-Lipid Panel (80061-LIPID)  Labs Reviewed: SGOT: 25 (01/30/2009)   SGPT: 25 (01/30/2009)   HDL:63.20 (06/04/2008), 52.0 (07/19/2007)  LDL:75 (06/04/2008), 87 (07/19/2007)  Chol:160 (06/04/2008), 152 (07/19/2007)  Trig:108.0 (06/04/2008), 66 (07/19/2007) stable overall by hx and exam, ok to continue meds/tx as is , Pt to continue diet efforts, good med tolerance; to check labs - goal LDL less than 70   Complete Medication List: 1)  Lisinopril 10 Mg Tabs (Lisinopril) .... One by mouth once daily 2)  Nexium 40 Mg Cpdr (Esomeprazole magnesium) .Marland Kitchen.. 1 by mouth once daily 3)  Low-dose Aspirin 81 Mg Tabs (Aspirin) .... One by mouth once daily 4)  Crestor 20 Mg Tabs (Rosuvastatin calcium) .Marland Kitchen.. 1 by mouth once daily 5)  Nitroquick 0.4 Mg Subl (Nitroglycerin) .... One by mouth sl as needed 6)  Tramadol-acetaminophen 37.5-325 Mg Tabs (Tramadol-acetaminophen) .... One by mouth two times a day as needed 7)  Vitamin D 1000 Unit Tabs (Cholecalciferol) .Marland Kitchen.. 1 by mouth once daily 8)  Skelaxin 800 Mg Tabs (Metaxalone) .... As needed 9)  Viagra 100 Mg Tabs (Sildenafil citrate) .Marland Kitchen.. 1 by mouth every other day as needed 10)  Alendronate Sodium 70 Mg Tabs (Alendronate sodium) .Marland Kitchen.. 1 by mouth q wk  Other Orders: Pneumococcal Vaccine (60454) Admin 1st Vaccine (09811)  Patient Instructions: 1)  Please  schedule the bone density test before leaving today 2)  you had the pneumonia shot today 3)  Please go to the Lab in the basement for your blood and/or urine tests today 4)  Please call the number on the [facility] Card for results of your testing 5)  Continue all previous medications as before this visit  6)  Please schedule a follow-up appointment in 6 months, or sooner if needed Prescriptions: VIAGRA 100 MG TABS (SILDENAFIL CITRATE) 1 by mouth every other day as needed  #5 x 11   Entered and Authorized by:   Corwin Levins MD   Signed by:   Fayrene Fearing  Ellin Mayhew MD on 01/25/2010   Method used:   Print then Give to Patient   RxID:   [id number] TRAMADOL-ACETAMINOPHEN 37.5-325 MG  TABS (TRAMADOL-ACETAMINOPHEN) one by mouth two times a day as needed  #60 x 5   Entered and Authorized by:   Corwin Levins MD   Signed by:   Corwin Levins MD on 01/25/2010   Method used:   Print then Give to Patient   RxID:   [id number] NITROQUICK 0.4 MG  SUBL (NITROGLYCERIN) one by mouth sl as needed  #25 x 3   Entered and Authorized by:   Corwin Levins MD   Signed by:   Corwin Levins MD on 01/25/2010   Method used:   Print then Give to Patient   RxID:   [id number] CRESTOR 20 MG  TABS (ROSUVASTATIN CALCIUM) 1 by mouth once daily  #90 x 3   Entered and Authorized by:   Corwin Levins MD   Signed by:   Corwin Levins MD on 01/25/2010   Method used:   Print then Give to Patient   RxID:   [id number] NEXIUM 40 MG  CPDR (ESOMEPRAZOLE MAGNESIUM) 1 by mouth once daily  #90 x 3   Entered and Authorized by:   Corwin Levins MD   Signed by:   Corwin Levins MD on 01/25/2010   Method used:   Print then Give to Patient   RxID:   [id number] LISINOPRIL 10 MG  TABS (LISINOPRIL) one by mouth once daily  #90 x 3   Entered and Authorized by:   Corwin Levins MD   Signed by:   Corwin Levins MD on 01/25/2010   Method used:   Print then Give to Patient   RxID:   [id number]    Orders Added: 1)  T-Bone Densitometry  [77080] 2)  Pneumococcal Vaccine [54270] 3)  Admin 1st Vaccine [90471] 4)  TLB-BMP (Basic Metabolic Panel-BMET) [80048-METABOL] 5)  TLB-CBC Platelet - w/Differential [85025-CBCD] 6)  TLB-Hepatic/Liver Function Pnl [80076-HEPATIC] 7)  TLB-TSH (Thyroid Stimulating Hormone) [84443-TSH] 8)  TLB-Lipid Panel [80061-LIPID] 9)  Est. Patient Level IV [62376]   Immunizations Administered:  Pneumonia Vaccine:    Vaccine Type: Pneumovax    Site: left deltoid    Mfr: Merck    Dose: 0.5 ml    Route: IM    Given by: Zella Ball Ewing CMA (AAMA)    Exp. Date: 05/28/2011    Lot #: [id number]    VIS given: 12/08/08 version given January 25, 2010.   Immunizations Administered:  Pneumonia Vaccine:    Vaccine Type: Pneumovax    Site: left deltoid    Mfr: Merck    Dose: 0.5 ml    Route: IM    Given by: Zella Ball Ewing CMA (AAMA)    Exp. Date: 05/28/2011    Lot #: [id number]    VIS given: 12/08/08 version given January 25, 2010.

## 2010-03-18 LAB — BASIC METABOLIC PANEL
CO2: 30 mEq/L (ref 19–32)
Chloride: 105 mEq/L (ref 96–112)
GFR calc non Af Amer: 52 mL/min — ABNORMAL LOW (ref >60)
Glucose, Bld: 84 mg/dL (ref 70–99)
Potassium: 3.9 mEq/L (ref 3.5–5.1)
Sodium: 141 mEq/L (ref 135–145)

## 2010-03-18 LAB — POCT CARDIAC MARKERS
CKMB, poc: <1 ng/mL — ABNORMAL LOW (ref 1.0–8.0)
Troponin i, poc: <0.05 ng/mL (ref 0.00–0.09)

## 2010-03-18 LAB — DIFFERENTIAL
Basophils Relative: 0 % (ref 0–1)
Monocytes Absolute: 0.3 10*3/uL (ref 0.1–1.0)
Monocytes Relative: 5 % (ref 3–12)
Neutro Abs: 4.3 10*3/uL (ref 1.7–7.7)

## 2010-03-18 LAB — CBC
HCT: 42.2 % (ref 39.0–52.0)
Hemoglobin: 14.4 g/dL (ref 13.0–17.0)
MCHC: 34.2 g/dL (ref 30.0–36.0)
MCV: 94.1 fL (ref 78.0–100.0)

## 2010-05-18 NOTE — Assessment & Plan Note (Signed)
[facility]                            CARDIOLOGY OFFICE NOTE   NAME:[name] [name], [name] [name]                       MRN:          [id number]  DATE:06/16/2006                            DOB:          10/29/1924    [name] [name] presents today as an outpatient to the [name]  [facility] to establish care.   HISTORY OF PRESENT ILLNESS:  He is an 77 year old gentleman who has  recently moved here from the [place], [place], area.  [name] [name]  was followed by the [facility] in [place],  for moderate nonobstructive coronary artery disease.  He developed chest  pain syndrome back in 2006, and underwent a left heart catheterization.  I do not have the full report of his catheterization, but conclusion was  that he had moderate coronary artery disease with less than 50% stenosis  in his major epicardial vessels.  I do not have anymore details than  that at this point.  He is doing very well from a symptomatic  standpoint.  He has had a few fleeting chest pains at rest, but has not  had any exertional symptoms.  He has had no chest pain over the last 6  weeks.  He mows his lawn regularly without any symptoms.  He also walks  on a regular basis.  He denies dyspnea and claudication symptoms,  orthopnea, PND, edema, lightheadedness, syncope or palpitations.  He has  been tolerating his medical therapy for his coronary artery disease and  has no complaints at this time.   PAST MEDICAL HISTORY:  1. Coronary artery disease as detailed above.  No history of      myocardial infarction and preserved left ventricular function is      noted.  Myoview stress test in August 2006, demonstrated inferior      ischemia and followup catheterization showed nonobstructive CAD.      He has been noted to have preserved left ventricular function.  2. Essential hypertension.  3. Dyslipidemia.  4. Gastroesophageal reflux disease.  5. Spondylolysis.  6. Bilateral cataract surgery.  7. Bilateral inguinal hernia repair in 2004.  8. Osteoarthritis.   MEDICATIONS:  1. Aspirin 81 mg daily.  2. Lisinopril 10 mg daily.  3. Tramadol 50 mg as needed.  4. Nexium 40 mg daily.  5. Crestor 10 mg daily.  6. Boniva 150 mg daily.  7. Nitroglycerin as needed.   SOCIAL HISTORY:  The patient is retired.  He worked as a Building surveyor and retired at age 24.  He is married with three children.  He maintains an active lifestyle.  He does not smoke cigarettes and was  a smoker back in the 1940s, but quit in 1948.  He drinks alcohol in  moderation with only one to two beers weekly.   FAMILY HISTORY:  Mother died at age 63 of myocardial infarction.  Father  died at age 84 of a myocardial infarction.  He has two brothers and a  sister who are alive and well.   REVIEW OF SYSTEMS:  A complete 12-point review of systems was  performed.  Pertinent positives included occasionally dizziness with shoulder and  hand pain secondary to arthritis and gastroesophageal reflux symptoms.  All other systems were reviewed and are negative except as detailed  above.   PHYSICAL EXAMINATION:  GENERAL:  The patient is alert and oriented.  He  is in no acute distress.  He is a healthy-appearing, elderly gentleman.  VITAL SIGNS:  Weight 127 pounds, blood pressure 110/64 in the right arm,  100/60 in the left arm, heart rate 68, respirations 12.  HEENT:  Normal.  NECK:  Normal carotid upstrokes with a very soft, right carotid bruit.  Jugular venous pressure normal.  There is no thyromegaly or thyroid  nodules.  LUNGS:  Clear to auscultation bilaterally.  HEART:  The PMI is discrete and nondisplaced.  There is no right  ventricular heave or lift.  The heart is regular rate and rhythm without  murmurs or gallops.  ABDOMEN:  Soft, nontender, no organomegaly.  No abdominal bruits.  No  masses.  No rebound or guarding.  BACK:  No CVA tenderness.  EXTREMITIES:   No clubbing, cyanosis or edema.  Peripheral pulses are 2+  and equal throughout.  There are no femoral arterial bruits.  SKIN:  Warm and dry without rash.  NEUROLOGIC:  Cranial nerves 2-12 grossly intact.  Strength is 5/5 and  equal in the arms and legs bilaterally.   LABORATORY DATA AND X-RAY FINDINGS:  EKG demonstrates normal sinus  rhythm with a right bundle branch block pattern.  The ventricular rate  is 68 beats per minute and there are no significant ST segment or T wave  changes.  Carotid ultrasound study from May 2007, demonstrated 0-40%  stenosis of the right and left internal carotid arteries and normal  vertebral arterial flow.   ASSESSMENT/PLAN:  [name] [name] is currently stable from a cardiovascular  standpoint.  His cardiac problems are as follows:   1. Nonobstructive coronary artery disease.  We have requested the full      catheterization report from [place].  He maintains on excellent      medical therapy with ideal control of his blood pressure on      lisinopril, antiplatelet therapy with aspirin and Crestor for his      lipids.  At this point as he is active and asymptomatic, I do not      see any indication to repeat stress testing.  Will plan on seeing      him back in 6 months for regular followup and if he develops      symptoms, will consider stress test for symptomatic evaluation.  2. Hypertension.  As above, optimal blood pressure control on      lisinopril therapy.  3. Dyslipidemia, currently taking Crestor.  Goal LDL should be less      than 100.  Lipids under the management of Dr. Artist Pais.  4. Minor carotid plaques.  Consider followup carotid ultrasound in 2      years.  5. For followup, I will plan on seeing [name] [name] back in 6 months or      sooner if any new problems arise.     Veverly Fells. Excell Seltzer, MD  Electronically Signed    MDC/MedQ  DD: 06/19/2006  DT: 06/19/2006  Job #: [id number]   cc:   Barbette Hair. Artist Pais, DO

## 2010-05-18 NOTE — Assessment & Plan Note (Signed)
[facility]                            CARDIOLOGY OFFICE NOTE   NAME:Full, BRODY                       MRN:          [id number]  DATE:12/07/2006                            DOB:          15-Mar-1924    [name] returns for followup at the [facility] Office on  December 07, 2006.  Mr. Westberg is a very nice 77 year old gentleman with  nonobstructive coronary artery disease.  He has been cared for in  [place], [place] where he underwent a cardiac catheterization back  in September 2006.  We have received a report of his catheterization and  he had minimal disease in the left main stem of the LAD with a 50%  stenosis in the AV groove circumflex and 30-50% stenoses in the right  coronary artery.  His left ventricular function was normal with an LVEF  of 68% and his LV diastolic pressure was normal at 15 mmHg.   Mr. Gaspard remains very active.  He has been doing a lot of yard work  with a Firefighter, as well as blowing leaves in his yard.  He has had no  symptoms with that level of exertion.  He specifically denies chest  pain, dyspnea, palpitations, light-headedness, orthopnea, PND, or edema.   CURRENT MEDICATIONS:  1. Lisinopril 10 mg daily.  2. Nexium 40 mg daily.  3. Aspirin 81 mg daily.  4. Boniva 150 mg monthly.  5. Crestor 10 mg daily.   EXAM:  The patient is alert and oriented.  Elderly male in no acute  distress.  Weight 137, blood pressure 122/79, heart rate is 66, respiratory rate  12.  HEENT:  Normal.  NECK:  Normal carotid upstrokes with a right carotid bruit.  LUNGS:  Clear to auscultation bilaterally.  HEART:  Regular rate and rhythm without murmurs or gallops.  ABDOMEN:  Soft and nontender.  No organomegaly.  EXTREMITIES:  No cyanosis, clubbing, or edema.  Peripheral pulses 2+ and  equal throughout.   EKG shows normal sinus rhythm with right bundle branch block.   ASSESSMENT:  1. Nonobstructive coronary  artery disease.  No signs of angina.      Continue medical therapy with aspirin and lisinopril.  The      patient's blood pressure is under optimal control.  2. Carotid bruit.  Check carotid ultrasound to rule out significant      stenosis.  3. Dyslipidemia.  The patient is currently on Crestor.  He is managed      by Dr. Artist Pais.  Total cholesterol in June of this year was 171 with an      LDL of 92 and an HDL of 61.   For followup, I would like to see Mr. Plucinski back on a yearly basis.  If he has problems, I would be happy to see him in the interim.     Veverly Fells. Excell Seltzer, MD  Electronically Signed    MDC/MedQ  DD: 12/07/2006  DT: 12/07/2006  Job #: [id number]   cc:   Barbette Hair. Artist Pais, DO

## 2010-05-18 NOTE — Assessment & Plan Note (Signed)
[facility]                           PRIMARY CARE OFFICE NOTE   NAME:[name], [name]                      MRN:          [id number]  DATE:05/16/2006                            DOB:          [birth year]    HISTORY AND PHYSICAL EXAMINATION   CHIEF COMPLAINT:  New patient to practice.   HISTORY OF PRESENT ILLNESS:  The patient is an 77 year old white male  here to establish primary care. He has recently moved back to this area  from [place], [place].   PAST MEDICAL HISTORY:  Significant for:  Coronary artery disease. He was followed by cardiologist in [place] who  performed a cardiac cath in 2006 secondary to an abnormal stress test.  The patient was noted to have non-obstructive coronary disease. Details  not available, but review of his previous records notes up to 50%  coronary artery disease. He was elected to be treated medically and  underwent aggressive risk factor management. He is previously known to  be on Lipitor, but review of his current medications is not noting that.   He was scheduled to have a repeat stress test in April of 2008, but due  to the move, this was not performed. From a symptom standpoint, he has  been doing very well. He exercises on a regular basis despite chronic  hip pain and lumbar and thoracic spondylosis. He is able to mow the  grass without significant shortness of breath and chest discomfort.   He has been evaluated by Dr.  Renae [name], a local orthopedic physician, for  chronic back pain. 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 Since then, he has had chronic back problems. We do  not have copies of x-rays of his back. Dr.  Renae [name] apparently recommended  epidural injection. The patient has deferred.   He also notes chronic pain in his shoulders from bursitis and has a  partially torn rotator cuff in his right shoulder. He also has chronic  left hip pain.   PAST MEDICAL HISTORY  SUMMARY:  1. Coronary artery disease, non-obstructive last cath 2006.  2. Normal LV size and systolic function.  3. Mild diastolic dysfunction. Mild aortic regurgitation and mild      mitral regurgitation.  4. Gastroesophageal reflux disease.  5. Dyslipidemia.  6. Chronic lumbar spondylosis.  7. Right shoulder partial rotator cuff tear.  8. Left shoulder bursitis.  9. Osteoporosis.  10.Erectile dysfunction.   CURRENT MEDICATIONS:  1. Lisinopril 10 mg once a day.  2. Nexium 40 mg once a day.  3. Baby aspirin 81 mg once a day.  4. Boniva 150 mg q monthly.  5. Viagra 100 mg one-half p.r.n.  6. Nitroglycerine tabs 0.4 mg p.r.n.   ALLERGIES:  To medications include:  PROTONIX, ACIPHEX WHICH MAY HAVE CAUSED RASH. INTOLERANCE TO BIAXIN.   SOCIAL HISTORY:  The patient is married. He has been married for the  last 57 years. 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   FAMILY  HISTORY:  Notable for heart disease in his father who passed away  at age 13. Mother also noted to have coronary artery disease, deceased  at age 80. Also, has a brother that died at age 62 secondary to coronary  artery disease.   HABITS:  He seldom drinks. He denies any history of tobacco use.   PREVENTATIVE CARE HISTORY:  Last tetanus shot was in 2000. Last colon  screening was in 2004.   REVIEW OF SYSTEMS:  No fevers, chills. No HEENT symptoms. No chest pain.  No dyspnea. Denies heartburn, nausea, vomiting, diarrhea or  constipation. Chronic back pain as noted above. All other systems  negative.   PHYSICAL EXAMINATION:  VITAL SIGNS: Weight is 131 pounds. Temperature  97.3, pulse 76, blood pressure 119/72.  In general, the patient is a very pleasant, thin, 77 year old white male  in no apparent distress.  HEENT: Normocephalic, atraumatic. Pupils are equal and reactive to light  bilaterally. Extra-ocular muscles were intact. The  patient was  anicteric. Conjunctivae was within normal limits. Gross hearing was  slightly diminished.  Oropharyngeal examination was unremarkable.  NECK: Was supple. No adenopathy, carotid bruits or thyromegaly.  CHEST: Normal respiratory effort. Clear to auscultation bilaterally. No  rhonchi, rales or wheezing.  CARDIOVASCULAR: Regular rate and rhythm. No significant murmurs, rubs or  gallops appreciated.  ABDOMEN: Soft and nontender. Positive bowel sounds. No organomegaly.  MUSCULOSKELETAL: The patient has slightly abnormal gait with asymmetric  hip height.  NEUROLOGIC: Cranial nerves II-XII was grossly intact. He was nonfocal.  His mood and affect were appropriate.   EKG showed normal sinus rhythm at 62 beats per minute, right bundle  branch block.   IMPRESSIONS AND RECOMMENDATIONS:  1. History of coronary artery disease, non-obstructive.  2. Normal LV function.  3. Hyperlipidemia.  4. Hypertension, controlled.  5. Severe spondylosis of the spine.  6. History of right shoulder rotator cuff tear.  7. Chronic hip pain.  8. Erectile dysfunction.  9. Gastroesophageal reflux disease.  10.Health maintenance.   RECOMMENDATIONS:  1. Discussed importance of maintaining statin therapy. He was started      on Crestor 10 mg once a day. He will followup in one month for      followup. LFTs and lipids with goal LDL of less than 70.  2. We will repeat other labs including baseline kidney function,      thyroid studies.  3. He will be referred to [facility] for further followup care      and to arrange followup outpatient adenosine Myoview.  4. Followup time in approximately six weeks.     Barbette Hair. Artist Pais, DO     RDY/MedQ  DD: 05/16/2006  DT: 05/16/2006  Job #: [id number]

## 2010-05-18 NOTE — Assessment & Plan Note (Signed)
[facility]                            CARDIOLOGY OFFICE NOTE   NAME:[name], [name]                       MRN:          [id number]  DATE:12/12/2007                            DOB:          04/11/1924    REASON FOR VISIT:  Coronary artery disease.   HISTORY OF PRESENT ILLNESS:  [name] [name] is an 77 year old gentleman  presenting today for yearly followup of his coronary artery disease.  He  underwent cardiac catheterization back in 2006 demonstrating 50%  stenosis of the left circumflex and right coronary artery and  nonobstructive disease elsewhere.  He has normal LV function.  He  remains asymptomatic.  He walks 15-20 minutes daily with no symptoms.  He specifically denies chest pain or pressure.  He has no dyspnea,  orthopnea, PND, or edema.  He has no complaints today.   MEDICATIONS:  1. Lisinopril 10 mg daily.  2. Nexium 40 mg daily.  3. Aspirin 81 mg daily.  4. Boniva 150 mg monthly.  5. Crestor 10 mg daily.   ALLERGIES:  PROTONIX, ACIPHEX, BIAXIN.   PHYSICAL EXAMINATION:  GENERAL:  The patient is alert and oriented,  elderly male, fit-appearing, in no acute distress.  VITAL SIGNS:  Weight 141 pounds, blood pressure 122/74, heart rate 62,  respiratory rate 16.  HEENT:  Normal.  NECK:  Normal carotid upstrokes with a soft right carotid bruit.  JVP  normal.  LUNGS:  Clear bilaterally.  HEART:  Regular rate and rhythm.  No murmurs or gallops.  ABDOMEN:  Soft and nontender.  No organomegaly.  EXTREMITIES:  No clubbing, cyanosis, or edema.   EKG shows normal sinus rhythm with a right bundle-branch block.  The  right bundle-branch block is more pronounced than that of his previous  ECG in June 2008.  There are no significant ST or T-wave changes.   ASSESSMENT:  1. Nonobstructive coronary artery disease.  The patient is without      signs of angina.  Continue current medical therapy without changes.      I would like to see him back in 1  year.  2. Asymptomatic carotid bruit.  He underwent a carotid ultrasound 1      year ago that showed mild plaque with no significant stenosis in      his carotid arteries.  3. Hypertension, blood pressure under good control on lisinopril.      Followed by Dr. Jonny Ruiz.  4. Dyslipidemia.  Lipids are at goal on Crestor 10 mg.  Most recent      assessment from July 19, 2007, showed a cholesterol of 152, HDL 52,      LDL 87, triglycerides 66.   For followup, I will see [name] [name] in 1 year.     Veverly Fells. Excell Seltzer, MD  Electronically Signed    MDC/MedQ  DD: 12/12/2007  DT: 12/13/2007  Job #: [id number]   cc:   Corwin Levins, MD

## 2010-07-25 ENCOUNTER — Encounter: Payer: Self-pay | Admitting: Internal Medicine

## 2010-07-28 ENCOUNTER — Ambulatory Visit: Payer: Self-pay | Admitting: Internal Medicine

## 2010-07-29 ENCOUNTER — Ambulatory Visit (INDEPENDENT_AMBULATORY_CARE_PROVIDER_SITE_OTHER): Payer: Medicare Other | Admitting: Internal Medicine

## 2010-07-29 ENCOUNTER — Encounter: Payer: Self-pay | Admitting: Internal Medicine

## 2010-07-29 VITALS — BP 120/72 | HR 73 | Temp 97.8°F | Ht 68.0 in | Wt 135.2 lb

## 2010-07-29 DIAGNOSIS — M79673 Pain in unspecified foot: Secondary | ICD-10-CM | POA: Insufficient documentation

## 2010-07-29 DIAGNOSIS — E785 Hyperlipidemia, unspecified: Secondary | ICD-10-CM

## 2010-07-29 DIAGNOSIS — M79609 Pain in unspecified limb: Secondary | ICD-10-CM

## 2010-07-29 DIAGNOSIS — I10 Essential (primary) hypertension: Secondary | ICD-10-CM

## 2010-07-29 DIAGNOSIS — K219 Gastro-esophageal reflux disease without esophagitis: Secondary | ICD-10-CM

## 2010-07-29 DIAGNOSIS — Z0000 Encounter for general adult medical examination without abnormal findings: Secondary | ICD-10-CM

## 2010-07-29 NOTE — Assessment & Plan Note (Signed)
stable overall by hx and exam, most recent data reviewed with pt, and pt to continue medical treatment as before  Lab Results  Component Value Date   LDLCALC 61 01/25/2010

## 2010-07-29 NOTE — Assessment & Plan Note (Signed)
stable overall by hx and exam, most recent data reviewed with pt, and pt to continue medical treatment as before  Lab Results  Component Value Date   WBC 6.1 01/25/2010   HGB 14.9 01/25/2010   HCT 43.5 01/25/2010   PLT 194.0 01/25/2010   CHOL 144 01/25/2010   TRIG 98.0 01/25/2010   HDL 63.40 01/25/2010   ALT 21 01/25/2010   AST 23 01/25/2010   NA 138 01/25/2010   K 4.4 01/25/2010   CL 102 01/25/2010   CREATININE 1.3 01/25/2010   BUN 20 01/25/2010   CO2 29 01/25/2010   TSH 1.25 01/25/2010   HGBA1C 5.7 05/12/2009

## 2010-07-29 NOTE — Progress Notes (Signed)
Subjective:    Patient ID: [name] [name], male    DOB: [birth year], 77 y.o.   MRN: [id number]  HPI Here to f/u; overall doing ok,  Pt denies chest pain, increased sob or doe, wheezing, orthopnea, PND, increased LE swelling, palpitations, dizziness or syncope.  Pt denies new neurological symptoms such as new headache, or facial or extremity weakness or numbness   Pt denies polydipsia, polyuria, or low sugar symptoms such as weakness or confusion improved with po intake.  Pt states overall good compliance with meds, trying to follow lower cholesterol diet, wt overall stable but little exercise however. Denies worsening reflux, dysphagia, abd pain, n/v, bowel change or blood.  Does have mild right foot discomfort, as well as lower back discomfort with the pelvic tilt due to the scoliosis Past Medical History  Diagnosis Date  . ALLERGIC RHINITIS 05/12/2009  . BACK PAIN 01/15/2007  . BURSITIS, LEFT SHOULDER 11/01/2006  . CORONARY ARTERY DISEASE 11/01/2006  . Dizziness and giddiness 09/29/2008  . DYSLIPIDEMIA 11/01/2006  . 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 FATIGUE 06/04/2008  . GERD 01/14/2006  . GROIN STRAIN, RIGHT 12/24/2009  . HIP PAIN, CHRONIC 11/01/2006  . HYPERTENSION 11/01/2006  . LESION, FACE 09/10/2009  . LUNG NODULE 01/25/2010  . MACULAR DEGENERATION, BILATERAL 01/30/2009  . OSTEOPOROSIS 07/25/2007  . Pain in joint, shoulder region 03/04/2009  . PERIPHERAL VASCULAR DISEASE 07/25/2007  . Plantar wart 09/10/2009  . Preventative health care 07/25/2010  . RASH-NONVESICULAR 09/29/2008  . RECTAL PAIN 01/15/2007  . ROTATOR CUFF TEAR 11/01/2006  . SPONDYLOSIS, LUMBAR 11/01/2006  . SWELLING MASS OR LUMP IN HEAD AND NECK 01/29/2008   Past Surgical History  Procedure Date  . Cataract extraction, bilateral   . Tonsillectomy   . Inguinal herniorrhapy bilateral     reports that he has never smoked. He does not have any smokeless tobacco history on file. He reports that he does not drink alcohol. His  drug history not on file. family history is not on file. Allergies  Allergen Reactions  . Clarithromycin   . Pantoprazole Sodium   . Rabeprazole Sodium   . Tadalafil    Current Outpatient Prescriptions on File Prior to Visit  Medication Sig Dispense Refill  . aspirin 81 MG tablet Take 81 mg by mouth daily.        . cholecalciferol (VITAMIN D) 1000 UNITS tablet Take 1,000 Units by mouth daily.        Marland Kitchen esomeprazole (NEXIUM) 40 MG capsule Take 40 mg by mouth daily.        Marland Kitchen lisinopril (PRINIVIL,ZESTRIL) 10 MG tablet Take 10 mg by mouth daily.        . nitroGLYCERIN (NITROSTAT) 0.4 MG SL tablet Place 0.4 mg under the tongue every 5 (five) minutes as needed.        . rosuvastatin (CRESTOR) 20 MG tablet Take 20 mg by mouth daily.        . traMADol-acetaminophen (ULTRACET) 37.5-325 MG per tablet Take 1 tablet by mouth 2 (two) times daily as needed.         Review of Systems Review of Systems  Constitutional: Negative for diaphoresis and unexpected weight change.  HENT: Negative for drooling and tinnitus.   Eyes: Negative for photophobia and visual disturbance.  Respiratory: Negative for choking and stridor.   Gastrointestinal: Negative for vomiting and blood in stool.  Genitourinary: Negative for hematuria and decreased urine volume.        Objective:   Physical Exam BP 120/72  Pulse 73  Temp(Src) 97.8 F (36.6 C) (Oral)  Ht 5\' 8"  (1.727 m)  Wt 135 lb 4 oz (61.349 kg)  BMI 20.56 kg/m2  SpO2 97% Physical Exam  VS noted Constitutional: Pt appears well-developed and well-nourished.  HENT: Head: Normocephalic.  Right Ear: External ear normal.  Left Ear: External ear normal.  Eyes: Conjunctivae and EOM are normal. Pupils are equal, round, and reactive to light.  Neck: Normal range of motion. Neck supple.  Cardiovascular: Normal rate and regular rhythm.   Pulmonary/Chest: Effort normal and breath sounds normal.  Abd:  Soft, NT, non-distended, + BS Neurological: Pt is alert. No  cranial nerve deficit.  Skin: Skin is warm. No erythema. small left elbow contusion noted Psychiatric: Pt behavior is normal. Thought content normal.  Marked chronic scoliosis noted, nontender; right foot without swelling, red, tender    Assessment & Plan:   HYPERTENSION stable overall by hx and exam, most recent data reviewed with pt, and pt to continue medical treatment as before  BP Readings from Last 3 Encounters:  07/29/10 120/72  01/25/10 126/72  12/24/09 110/70     GERD stable overall by hx and exam, most recent data reviewed with pt, and pt to continue medical treatment as before  Lab Results  Component Value Date   WBC 6.1 01/25/2010   HGB 14.9 01/25/2010   HCT 43.5 01/25/2010   PLT 194.0 01/25/2010   CHOL 144 01/25/2010   TRIG 98.0 01/25/2010   HDL 63.40 01/25/2010   ALT 21 01/25/2010   AST 23 01/25/2010   NA 138 01/25/2010   K 4.4 01/25/2010   CL 102 01/25/2010   CREATININE 1.3 01/25/2010   BUN 20 01/25/2010   CO2 29 01/25/2010   TSH 1.25 01/25/2010   HGBA1C 5.7 05/12/2009     DYSLIPIDEMIA stable overall by hx and exam, most recent data reviewed with pt, and pt to continue medical treatment as before  Lab Results  Component Value Date   LDLCALC 61 01/25/2010

## 2010-07-29 NOTE — Patient Instructions (Addendum)
Continue all other medications as before You will be contacted regarding the referral for: podiatry Please return in 6 mo with Lab testing done 3-5 days before

## 2010-07-29 NOTE — Assessment & Plan Note (Signed)
Mild, likely related to back pain/scoliois, likely needs shoe lift - for podiatry refill

## 2010-07-29 NOTE — Assessment & Plan Note (Signed)
stable overall by hx and exam, most recent data reviewed with pt, and pt to continue medical treatment as before  BP Readings from Last 3 Encounters:  07/29/10 120/72  01/25/10 126/72  12/24/09 110/70

## 2010-10-08 ENCOUNTER — Telehealth: Payer: Self-pay

## 2010-10-08 NOTE — Telephone Encounter (Signed)
Patient called to inform has not been able to get his nexium. Informed PA has not been completed and will do today. Left the patient samples of Nexium 40 mg at the front desk to hold him until PA has been completed. Patient agreed to pickup today 10/08/2010.

## 2010-10-08 NOTE — Telephone Encounter (Signed)
Called [phone] to initiate PA for Nexium 40 mg. Medication was approved from 08/05/2010 through 10/08/2011. They will fax approval letter in a couple days. Called the pharmacy as well as the patient informed of approval. Patient UJ#[id number].

## 2010-10-19 ENCOUNTER — Ambulatory Visit (INDEPENDENT_AMBULATORY_CARE_PROVIDER_SITE_OTHER): Payer: Federal, State, Local not specified - PPO | Admitting: Cardiovascular Disease

## 2010-10-19 ENCOUNTER — Encounter: Payer: Self-pay | Admitting: Cardiovascular Disease

## 2010-10-19 DIAGNOSIS — K469 Unspecified abdominal hernia without obstruction or gangrene: Secondary | ICD-10-CM

## 2010-10-19 DIAGNOSIS — E785 Hyperlipidemia, unspecified: Secondary | ICD-10-CM

## 2010-10-19 DIAGNOSIS — I2510 Atherosclerotic heart disease of native coronary artery without angina pectoris: Secondary | ICD-10-CM

## 2010-10-19 DIAGNOSIS — I10 Essential (primary) hypertension: Secondary | ICD-10-CM

## 2010-10-19 NOTE — Assessment & Plan Note (Signed)
Lipids are excellent on Crestor with an HDL of 63 and an LDL of 61.

## 2010-10-19 NOTE — Assessment & Plan Note (Signed)
Blood pressure is well controlled on lisinopril. 

## 2010-10-19 NOTE — Patient Instructions (Signed)
Your physician wants you to follow-up in: 1 YEAR.  You will receive a reminder letter in the mail two months in advance. If you don't receive a letter, please call our office to schedule the follow-up appointment.  You have been referred to [facility] for evaluation of Right Inguinal Hernia  Your physician recommends that you continue on your current medications as directed. Please refer to the Current Medication list given to you today.

## 2010-10-19 NOTE — Assessment & Plan Note (Signed)
The patient is stable without angina. He is on a good medical program with aspirin, lisinopril, and Crestor. I'll plan on seeing him back in one year.  With respect to his right groin pain, referred him to [facility] surgery for evaluation of a possible inguinal hernia.

## 2010-10-19 NOTE — Progress Notes (Signed)
HPI:  This is an 77 year old gentleman returning for followup evaluation. The patient has a history of nonobstructive coronary artery disease documented by cardiac catheterization several years ago in [place]. Last year he developed acute onset of chest and back pain and underwent an evaluation in the emergency department. A CT angiogram of the chest showed no significant abnormalities. A followup stress test showed no evidence of myocardial ischemia. This appeared to be an isolated incident and the patient has had no further problems. He is physically active and has no exertional symptoms. He denies dyspnea, chest pressure, edema, or palpitations. He complains of swelling and pain in the right groin area. He's had prior hernia surgery on both sides and he is concerned he may have a recurrence.  Outpatient Encounter Prescriptions as of 10/19/2010  Medication Sig Dispense Refill  . aspirin 81 MG tablet Take 81 mg by mouth daily.        . cholecalciferol (VITAMIN D) 1000 UNITS tablet Take 1,000 Units by mouth daily.        Marland Kitchen esomeprazole (NEXIUM) 40 MG capsule Take 40 mg by mouth daily.        Marland Kitchen lisinopril (PRINIVIL,ZESTRIL) 10 MG tablet Take 10 mg by mouth daily.        . nitroGLYCERIN (NITROSTAT) 0.4 MG SL tablet Place 0.4 mg under the tongue every 5 (five) minutes as needed.        . rosuvastatin (CRESTOR) 20 MG tablet Take 20 mg by mouth daily.        . traMADol-acetaminophen (ULTRACET) 37.5-325 MG per tablet Take 1 tablet by mouth 2 (two) times daily as needed.          Allergies  Allergen Reactions  . Clarithromycin   . Pantoprazole Sodium   . Rabeprazole Sodium   . Tadalafil     Past Medical History  Diagnosis Date  . ALLERGIC RHINITIS 05/12/2009  . BACK PAIN 01/15/2007  . BURSITIS, LEFT SHOULDER 11/01/2006  . CORONARY ARTERY DISEASE 11/01/2006  . Dizziness and giddiness 09/29/2008  . DYSLIPIDEMIA 11/01/2006  . 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 FATIGUE 06/04/2008  . GERD  01/14/2006  . GROIN STRAIN, RIGHT 12/24/2009  . HIP PAIN, CHRONIC 11/01/2006  . HYPERTENSION 11/01/2006  . LESION, FACE 09/10/2009  . LUNG NODULE 01/25/2010  . MACULAR DEGENERATION, BILATERAL 01/30/2009  . OSTEOPOROSIS 07/25/2007  . Pain in joint, shoulder region 03/04/2009  . PERIPHERAL VASCULAR DISEASE 07/25/2007  . Plantar wart 09/10/2009  . Preventative health care 07/25/2010  . RASH-NONVESICULAR 09/29/2008  . RECTAL PAIN 01/15/2007  . ROTATOR CUFF TEAR 11/01/2006  . SPONDYLOSIS, LUMBAR 11/01/2006  . SWELLING MASS OR LUMP IN HEAD AND NECK 01/29/2008    ROS: Negative except as per HPI  BP 120/68  Pulse 69  Resp 18  Ht 5\' 7"  (1.702 m)  Wt 131 lb 12.8 oz (59.784 kg)  BMI 20.64 kg/m2  PHYSICAL EXAM: Pt is alert and oriented, elderly male in NAD HEENT: normal Neck: JVP - normal, carotids 2+= without bruits Lungs: CTA bilaterally CV: RRR without murmur or gallop Abd: soft, NT, Positive BS, no hepatomegaly.  There is a reducible and soft mass in the right groin area Ext: no C/C/E, distal pulses intact and equal Skin: warm/dry no rash  EKG:  Normal sinus rhythm with right bundle branch block, heart rate 70 beats per minute.  ASSESSMENT AND PLAN:

## 2010-10-31 ENCOUNTER — Other Ambulatory Visit: Payer: Self-pay | Admitting: Emergency Medicine

## 2010-10-31 ENCOUNTER — Emergency Department (HOSPITAL_COMMUNITY): Payer: Medicare Other

## 2010-10-31 ENCOUNTER — Emergency Department (HOSPITAL_COMMUNITY)
Admission: EM | Admit: 2010-10-31 | Discharge: 2010-10-31 | Disposition: A | Discharge status: Home / Self Care | Payer: Medicare Other | Attending: Emergency Medicine | Admitting: Emergency Medicine

## 2010-10-31 DIAGNOSIS — N23 Unspecified renal colic: Secondary | ICD-10-CM | POA: Insufficient documentation

## 2010-10-31 DIAGNOSIS — M4120 Other idiopathic scoliosis, site unspecified: Secondary | ICD-10-CM | POA: Insufficient documentation

## 2010-10-31 DIAGNOSIS — Z7982 Long term (current) use of aspirin: Secondary | ICD-10-CM | POA: Insufficient documentation

## 2010-10-31 DIAGNOSIS — M47817 Spondylosis without myelopathy or radiculopathy, lumbosacral region: Secondary | ICD-10-CM | POA: Insufficient documentation

## 2010-10-31 DIAGNOSIS — K4090 Unilateral inguinal hernia, without obstruction or gangrene, not specified as recurrent: Secondary | ICD-10-CM | POA: Insufficient documentation

## 2010-10-31 DIAGNOSIS — Z79899 Other long term (current) drug therapy: Secondary | ICD-10-CM | POA: Insufficient documentation

## 2010-10-31 DIAGNOSIS — K219 Gastro-esophageal reflux disease without esophagitis: Secondary | ICD-10-CM | POA: Insufficient documentation

## 2010-11-01 ENCOUNTER — Ambulatory Visit (INDEPENDENT_AMBULATORY_CARE_PROVIDER_SITE_OTHER): Payer: Federal, State, Local not specified - PPO | Admitting: Surgery

## 2010-11-01 ENCOUNTER — Encounter (INDEPENDENT_AMBULATORY_CARE_PROVIDER_SITE_OTHER): Payer: Self-pay | Admitting: Surgery

## 2010-11-01 ENCOUNTER — Emergency Department (HOSPITAL_COMMUNITY)
Admission: EM | Admit: 2010-11-01 | Discharge: 2010-11-01 | Disposition: A | Discharge status: Home / Self Care | Payer: Medicare Other | Attending: Emergency Medicine | Admitting: Emergency Medicine

## 2010-11-01 ENCOUNTER — Emergency Department (HOSPITAL_COMMUNITY): Payer: Medicare Other

## 2010-11-01 VITALS — BP 132/78 | HR 68 | Temp 97.2°F | Resp 16 | Ht 68.0 in | Wt 127.8 lb

## 2010-11-01 DIAGNOSIS — M479 Spondylosis, unspecified: Secondary | ICD-10-CM | POA: Insufficient documentation

## 2010-11-01 DIAGNOSIS — M4120 Other idiopathic scoliosis, site unspecified: Secondary | ICD-10-CM | POA: Insufficient documentation

## 2010-11-01 DIAGNOSIS — N201 Calculus of ureter: Secondary | ICD-10-CM | POA: Insufficient documentation

## 2010-11-01 DIAGNOSIS — K4090 Unilateral inguinal hernia, without obstruction or gangrene, not specified as recurrent: Secondary | ICD-10-CM

## 2010-11-01 DIAGNOSIS — K219 Gastro-esophageal reflux disease without esophagitis: Secondary | ICD-10-CM | POA: Insufficient documentation

## 2010-11-01 DIAGNOSIS — Z7982 Long term (current) use of aspirin: Secondary | ICD-10-CM | POA: Insufficient documentation

## 2010-11-01 DIAGNOSIS — Z79899 Other long term (current) drug therapy: Secondary | ICD-10-CM | POA: Insufficient documentation

## 2010-11-01 LAB — URINE MICROSCOPIC-ADD ON

## 2010-11-01 LAB — URINALYSIS, ROUTINE W REFLEX MICROSCOPIC
Leukocytes, UA: NEGATIVE
Leukocytes, UA: NEGATIVE
Nitrite: NEGATIVE
Nitrite: NEGATIVE
Protein, ur: NEGATIVE mg/dL
Specific Gravity, Urine: 1.022 (ref 1.005–1.030)
Specific Gravity, Urine: 1.014 (ref 1.005–1.030)
Urobilinogen, UA: 0.2 mg/dL (ref 0.0–1.0)
pH: 5.5 (ref 5.0–8.0)

## 2010-11-01 LAB — BASIC METABOLIC PANEL
BUN: 20 mg/dL (ref 6–23)
CO2: 24 mEq/L (ref 19–32)
CO2: 25 mEq/L (ref 19–32)
Calcium: 9 mg/dL (ref 8.4–10.5)
Chloride: 105 mEq/L (ref 96–112)
Creatinine, Ser: 1.22 mg/dL (ref 0.50–1.35)
GFR calc non Af Amer: 53 mL/min — ABNORMAL LOW (ref >90)
GFR calc non Af Amer: 52 mL/min — ABNORMAL LOW (ref >90)
Glucose, Bld: 110 mg/dL — ABNORMAL HIGH (ref 70–99)
Potassium: 4 mEq/L (ref 3.5–5.1)
Sodium: 138 mEq/L (ref 135–145)

## 2010-11-01 LAB — DIFFERENTIAL
Basophils Absolute: 0 10*3/uL (ref 0.0–0.1)
Basophils Relative: 0 % (ref 0–1)
Eosinophils Absolute: 0 10*3/uL (ref 0.0–0.7)
Eosinophils Absolute: 0 10*3/uL (ref 0.0–0.7)
Eosinophils Relative: 0 % (ref 0–5)
Lymphocytes Relative: 10 % — ABNORMAL LOW (ref 12–46)
Lymphs Abs: 1.1 10*3/uL (ref 0.7–4.0)
Neutro Abs: 7.2 10*3/uL (ref 1.7–7.7)
Neutrophils Relative %: 85 % — ABNORMAL HIGH (ref 43–77)
Neutrophils Relative %: 82 % — ABNORMAL HIGH (ref 43–77)

## 2010-11-01 LAB — CBC
Hemoglobin: 13.2 g/dL (ref 13.0–17.0)
MCV: 90 fL (ref 78.0–100.0)
Platelets: 167 10*3/uL (ref 150–400)
Platelets: 191 10*3/uL (ref 150–400)
RBC: 4.09 MIL/uL — ABNORMAL LOW (ref 4.22–5.81)
RBC: 4.23 MIL/uL (ref 4.22–5.81)
RDW: 12.5 % (ref 11.5–15.5)
WBC: 6.7 10*3/uL (ref 4.0–10.5)
WBC: 8.8 10*3/uL (ref 4.0–10.5)

## 2010-11-01 NOTE — Progress Notes (Signed)
Chief Complaint  Patient presents with  . Other    Check hernia - right inguinal    HPI [name] [name] is a 77 y.o. male.   HPIThis is a pleasant gentleman referred by Dr. Oliver Barre for evaluation of a right inguinal hernia. He has noticed a hernia for some time and reports that he always easily reduces. Unfortunately he was an emergency last night with a near obstructing right kidney stone. He now is having significant discomfort in his lower back and flank and is nauseated. He is also having shaking chills. He has no emesis.  Past Medical History  Diagnosis Date  . ALLERGIC RHINITIS 05/12/2009  . BACK PAIN 01/15/2007  . BURSITIS, LEFT SHOULDER 11/01/2006  . CORONARY ARTERY DISEASE 11/01/2006  . Dizziness and giddiness 09/29/2008  . DYSLIPIDEMIA 11/01/2006  . 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 FATIGUE 06/04/2008  . GERD 01/14/2006  . GROIN STRAIN, RIGHT 12/24/2009  . HIP PAIN, CHRONIC 11/01/2006  . HYPERTENSION 11/01/2006  . LESION, FACE 09/10/2009  . LUNG NODULE 01/25/2010  . MACULAR DEGENERATION, BILATERAL 01/30/2009  . OSTEOPOROSIS 07/25/2007  . Pain in joint, shoulder region 03/04/2009  . PERIPHERAL VASCULAR DISEASE 07/25/2007  . Plantar wart 09/10/2009  . Preventative health care 07/25/2010  . RASH-NONVESICULAR 09/29/2008  . RECTAL PAIN 01/15/2007  . ROTATOR CUFF TEAR 11/01/2006  . SPONDYLOSIS, LUMBAR 11/01/2006  . SWELLING MASS OR LUMP IN HEAD AND NECK 01/29/2008  . Kidney stones     right  . Blood in urine     Past Surgical History  Procedure Date  . Cataract extraction, bilateral   . Tonsillectomy   . Inguinal herniorrhapy bilateral     History reviewed. No pertinent family history.  Social History History  Substance Use Topics  . Smoking status: Never Smoker   . Smokeless tobacco: Never Used  . Alcohol Use: Yes     occasional    Allergies  Allergen Reactions  . Clarithromycin   . Pantoprazole Sodium   . Rabeprazole Sodium   . Tadalafil     Current  Outpatient Prescriptions  Medication Sig Dispense Refill  . aspirin 81 MG tablet Take 81 mg by mouth daily.        . cholecalciferol (VITAMIN D) 1000 UNITS tablet Take 1,000 Units by mouth daily.        Marland Kitchen esomeprazole (NEXIUM) 40 MG capsule Take 40 mg by mouth daily.        Marland Kitchen lisinopril (PRINIVIL,ZESTRIL) 10 MG tablet Take 10 mg by mouth daily.        . nitroGLYCERIN (NITROSTAT) 0.4 MG SL tablet Place 0.4 mg under the tongue every 5 (five) minutes as needed.        . rosuvastatin (CRESTOR) 20 MG tablet Take 20 mg by mouth daily.        . traMADol-acetaminophen (ULTRACET) 37.5-325 MG per tablet Take 1 tablet by mouth 2 (two) times daily as needed.          Review of Systems Review of Systems  Constitutional: Positive for chills.  HENT: Negative.   Eyes: Negative.   Respiratory: Negative.   Cardiovascular: Negative.   Gastrointestinal: Positive for nausea.  Genitourinary: Positive for flank pain.  Musculoskeletal: Negative.   Neurological: Negative.   Hematological: Negative.   Psychiatric/Behavioral: Negative.     Blood pressure 132/78, pulse 68, temperature 97.2 F (36.2 C), temperature source Temporal, resp. rate 16, height 5\' 8"  (1.727 m), weight 127 lb 12.8 oz (57.97 kg).  Physical Exam  Physical Exam  Constitutional: He is oriented to person, place, and time. He appears distressed.  HENT:  Head: Normocephalic and atraumatic.  Right Ear: External ear normal.  Left Ear: External ear normal.  Nose: Nose normal.  Mouth/Throat: Oropharynx is clear and moist.  Eyes: Conjunctivae are normal. Pupils are equal, round, and reactive to light. No scleral icterus.  Neck: Normal range of motion. Neck supple. No tracheal deviation present. No thyromegaly present.  Cardiovascular: Normal rate, regular rhythm, normal heart sounds and intact distal pulses.   No murmur heard. Pulmonary/Chest: Effort normal and breath sounds normal. No respiratory distress. He has no wheezes.  Abdominal:  Soft. Bowel sounds are normal. He exhibits no mass. There is tenderness. There is guarding.       There is an easily reducible right inguinal hernia. There is no evidence of left hernia  Musculoskeletal: Normal range of motion.  Lymphadenopathy:    He has no cervical adenopathy.  Neurological: He is alert and oriented to person, place, and time.  Skin: Skin is warm and dry. No rash noted. No erythema.  Psychiatric: His behavior is normal. Judgment normal.    Data Reviewed   Assessment    Patient with an easily reducible right inguinal hernia as well as an obstructing right ureteral kidney stone.    Plan    He looks significantly ill from his kidney stone. I do not believe this is from the hernia. I am recommending laparoscopic right hernia repair with mesh electively as an outpatient. As he is pretty sick-looking, I'm going to send him back to the emergency department for further evaluation regarding a kidney stone. I briefly discussed the risks of inguinal hernia repair which includes but is not limited to bleeding, infection, injury to any structures, nerve entrapment, chronic pain, recurrence, etc. Likelihood success is good.       Aissata Wilmore A 11/01/2010, 9:28 AM

## 2010-11-05 ENCOUNTER — Ambulatory Visit (HOSPITAL_COMMUNITY)
Admission: RE | Admit: 2010-11-05 | Discharge: 2010-11-05 | Disposition: A | Discharge status: Home / Self Care | Payer: Medicare Other | Source: Ambulatory Visit | Attending: Urology | Admitting: Urology

## 2010-11-05 DIAGNOSIS — I2510 Atherosclerotic heart disease of native coronary artery without angina pectoris: Secondary | ICD-10-CM | POA: Insufficient documentation

## 2010-11-05 DIAGNOSIS — N1330 Unspecified hydronephrosis: Secondary | ICD-10-CM | POA: Insufficient documentation

## 2010-11-05 DIAGNOSIS — R42 Dizziness and giddiness: Secondary | ICD-10-CM | POA: Insufficient documentation

## 2010-11-05 DIAGNOSIS — K219 Gastro-esophageal reflux disease without esophagitis: Secondary | ICD-10-CM | POA: Insufficient documentation

## 2010-11-05 DIAGNOSIS — N201 Calculus of ureter: Secondary | ICD-10-CM | POA: Insufficient documentation

## 2010-11-05 LAB — SURGICAL PCR SCREEN
MRSA, PCR: NEGATIVE
Staphylococcus aureus: NEGATIVE

## 2010-11-08 ENCOUNTER — Other Ambulatory Visit (INDEPENDENT_AMBULATORY_CARE_PROVIDER_SITE_OTHER): Payer: Self-pay | Admitting: Surgery

## 2010-11-10 NOTE — Op Note (Signed)
NAME:  [name] [name], [name] [name] NO.:  [id number]  MEDICAL RECORD NO.:  [id number]  LOCATION:  [facility]                         FACILITY:  [facility]  PHYSICIAN:  Jerilee Field, MD   DATE OF BIRTH:  03-Nov-1924  DATE OF PROCEDURE: DATE OF DISCHARGE:                              OPERATIVE REPORT   PREOPERATIVE DIAGNOSES: 1. Right ureteral stone. 2. Right hydronephrosis.  POSTOPERATIVE DIAGNOSES: 1. Right ureteral stent. 2. Right hydronephrosis.  PROCEDURE:  Cystoscopy, right retrograde pyelogram and interpretation, right ureteroscopy, right ureteral stent placement.  SURGEON:  Jerilee Field, MD  INDICATION FOR PROCEDURE:  [name] [name] is an 77 year old male.  Earlier this week, he had significant onset of right flank pain.  He went to the emergency room.  A 5 mm right proximal stone was seen with mild hydronephrosis on the right.  I saw him in the office and we discussed stone passage with medical expulsion therapy versus cystoscopy with stent placement, possible ureteroscopy, laser versus shock wave lithotripsy.  KUB was performed and the stone was not visualized.  All questions were answered and the patient did elect to proceed with stone passage and medical management.  He returned today with uncontrolled pain.  He states he has not been able to sleep.  He has not been able to get comfortable and his pain has persisted despite oxycodone.  A CT scan was performed.  The stone had progressed to the right distal ureter at the iliacs or just below, but now there was moderate to severe right hydronephrosis with perinephric and periureteral stranding indicating high-grade obstruction.  Discussed the findings with the patient.  We discussed again the nature, risks, and benefits of continued passage versus cystoscopy with stent placement, possible ureteroscopy, laser lithotripsy, and stone removal.  All questions were answered and the patient and his wife elected to proceed  with cystoscopy and stent placement, possible ureteroscopy, laser lithotripsy, and stone removal. Interpretation of right pyelogram showed a normal distal ureter with a narrowed segment followed by a moderately dilated mid to proximal ureter with a tortuous proximal ureter, mild dilation of the renal pelvis and calices.  There was a small filling defect that started at the narrowed area of the ureter and seem to progress proximally as contrast was injected even with gentle contrast injection.  This may have been an air bubble versus small stone floating proximally.  Also, the stone is small and may be at the junction of the mid to distal ureter.  This area was not able to be investigated with the ureteroscope.  Therefore, the stone may still be in the ureter.  On scout imaging, there were no calcifications in the ureter noted, but multiple pelvic phleboliths.  DESCRIPTION OF PROCEDURE:  After consent was obtained, the patient brought to the operating room.  Time-out was performed to confirm the patient and procedure.  After adequate anesthesia, he was placed in lithotomy position.  SCDs and preop antibiotics had been given.  He was prepped and draped in usual fashion.  A rigid cystoscope was passed per urethra.  The bladder was inspected.  There was noted to be no stone or tumor in the bladder.  The right ureteral orifice  could not be cannulated with a 6-French open-ended catheter.  It was very narrow. Therefore, a Sensor wire was passed through an open-ended catheter into the distal ureter and a 6-French open-ended was advanced into the distal ureter.  Retrograde injection of contrast was performed with findings previously dictated.  Sensor wire was then advanced and coiled up into the upper pole of the kidney.  The 6-French open-ended catheter was removed.  The bladder was drained and the scope was removed.  A semirigid ureteroscope was then advanced into the distal ureter and I could  only go up and approach the iliacs but not get over them due to the angle of the ureter and the ureter narrowed in the segment, but then there were no visible stones in the distal ureter.  A second wire was advanced into the kidney and the rigid scope was removed.  The ureteral access sheath was advanced into the distal ureter without difficulty and the digital ureteroscope was advanced.  The digital ureteroscope went up to a point just to the iliac vessels, but again the ureter narrowed here and even the flexible ureteroscope would not pass proximally.  I did pass a wire through the flexible ureteroscope through the narrowed ureter, but the scope would still not pass.  Therefore, I felt best to passively dilate this area and leave a ureteral stent and reimage the patient in the outpatient office.  The second wire in the flexible ureteroscope removed and the wire was back loaded on the cystoscope and a 6 x 26 cm stent was placed.  Good coil was seen in the kidney and good coil in the bladder.  Patient's bladder was drained and the scope was removed.  He was then awakened.  Upon awakening, one of the staff thought the left hip "popped" when they placed him out of lithotomy back in supine.  Dr. Rennis Chris was kind enough to come into the operating room. We did an exam on the patient's hip and leg and there were no difficulties moving the leg and it was felt there was no dislocation. Also, we manipulated the leg under fluoro and again the femur and the hip appeared to be in normal anatomic alignment.  Patient was then awakened and taken to recovery room in stable condition.  SPECIMENS:  None.  DRAINS:  6 x 26 right ureteral stent.  ESTIMATED BLOOD LOSS:  Zero.  COMPLICATIONS:  None.  DISPOSITION:  Patient stable to PACU.          ______________________________ Jerilee Field, MD     ME/MEDQ  D:  11/05/2010  T:  11/06/2010  Job:  [id number]  Electronically Signed by Jerilee Field  MD on 11/10/2010 07:18:46 PM

## 2010-11-17 ENCOUNTER — Other Ambulatory Visit: Payer: Self-pay | Admitting: Urology

## 2010-11-19 ENCOUNTER — Encounter (HOSPITAL_BASED_OUTPATIENT_CLINIC_OR_DEPARTMENT_OTHER): Payer: Self-pay | Admitting: *Deleted

## 2010-11-19 NOTE — Progress Notes (Signed)
To [facility] at 1015.istat on arrival [facility] in epic chart.will take nexium am of procedure with sip water.

## 2010-11-22 NOTE — H&P (Addendum)
History of Present Illness            F/u right ureteral stone. CT 10/28 revealed a 5 mm right proximal stone. Stone not visible on KUB 10/29. Pt did not tolerate MET and returned to office with uncontrolled pain and nausea. CT Nov 05, 2010 showed stone in mid-distal ureter with moderate hydronephrosis. Pt elected to proceed with stent. URS was performed, but I couldnt get proximal to a narrow area of right ureter. No stone was seen.  Today, pt is feeling much better. He's been active and out blowing leaves this week. Minimal pain. Not taking many pain meds. No N/V, F/C.    KUB - right stent in good position. Stone appears to be in expected position in mid-distal ureter adjacent to stent. This area corresponds to area just proximal to narrow area on right RGP.   Past Medical History Problems  1. History of  Arthritis V13.4 2. History of  Esophageal Reflux 530.81 3. History of  Lumbar Spondylosis 721.3 4. History of  Nephrolithiasis V13.01 5. History of  Scoliosis 737.30  Surgical History Problems  1. History of  Cataract Surgery 2. History of  Coronary Artery Surgery 3. History of  Cystoscopy With Insertion Of Ureteral Stent Right 4. History of  Cystoscopy With Ureteroscopy Right 5. History of  Hernia Repair  Current Meds 1. Aspirin 81 MG Oral Tablet; Therapy: (Recorded:30Oct2012) to 2. Crestor 20 MG Oral Tablet; Therapy: 26Jun2012 to 3. Lisinopril 10 MG Oral Tablet; Therapy: (Recorded:30Oct2012) to 4. NexIUM 40 MG Oral Capsule Delayed Release; Therapy: (Recorded:30Oct2012) to 5. Oxycodone-Acetaminophen 5-325 MG Oral Tablet; Therapy: (Recorded:30Oct2012) to 6. Oxycodone-Acetaminophen 7.5-325 MG Oral Tablet; Therapy: 29Oct2012 to 7. Tamsulosin HCl 0.4 MG Oral Capsule; TAKE 1 CAPSULE Daily; Therapy: 30Oct2012 to  (Evaluate:29Nov2012)  Requested for: 30Oct2012; Last Rx:30Oct2012 8. Vitamin D TABS; Therapy: (Recorded:30Oct2012) to  Allergies Medication  1. Aciphex TBEC 2. Protonix  TBEC  Family History Problems  1. Family history of  Death In The Family Father Died age 75-Heart 2. Family history of  Death In The Family Mother Died age 66-Heart 3. Family history of  Family Health Status Number Of Children 2 boys and 1 girl 4. Paternal history of  Heart Disease V17.49 5. Maternal history of  Heart Disease V17.49 6. Fraternal history of  Heart Disease V17.49 7. Fraternal history of  Nephrolithiasis  Social History Problems  1. Alcohol Use 1-2 per week 2. Caffeine Use 2 per day 3. Marital History - Currently Married 4. Never A Smoker 5. Occupation: Retired Denied  6. History of  Tobacco Use 305.1  Review of Systems  Gastrointestinal: no nausea and no vomiting.  Cardiovascular: no chest pain.  Respiratory: no shortness of breath.    Vitals Vital Signs [Data Includes: Last 1 Day]  09Nov2012 02:51PM  Blood Pressure: 117 / 69 Temperature: 97.4 F Heart Rate: 80  Physical Exam Constitutional: Well nourished and well developed . No acute distress.  Pulmonary: No respiratory distress and normal respiratory rhythm and effort.  Cardiovascular: Heart rate and rhythm are normal . No peripheral edema.  Neuro/Psych:. Mood and affect are appropriate.    Results/Data Urine [Data Includes: Last 1 Day]  09Nov2012  COLOR: YELLOW  Reference Range YELLOW APPEARANCE: CLOUDY  Abnormal Reference Range CLEAR SPECIFIC GRAVITY: 1.020  Reference Range 1.005-1.030 pH: 5.5  Reference Range 5.0-8.0 GLUCOSE: NEG mg/dL Reference Range NEG BILIRUBIN: NEG  Reference Range NEG KETONE: NEG mg/dL Reference Range NEG BLOOD: LARGE  Abnormal Reference Range NEG PROTEIN:  100 mg/dL Abnormal Reference Range NEG UROBILINOGEN: 0.2 mg/dL Reference Range 7.2-5.3 NITRITE: NEG  Reference Range NEG LEUKOCYTE ESTERASE: MOD  Abnormal Reference Range NEG SQUAMOUS EPITHELIAL/HPF: FEW  Reference Range RARE WBC: 21-50 WBC/hpf Abnormal Reference Range <4 RBC: TNTC RBC/hpf Abnormal Reference  Range <4 BACTERIA: FEW  Abnormal Reference Range RARE CRYSTALS: NONE SEEN  Reference Range NEG CASTS: NONE SEEN  Reference Range NEG  Assessment Assessed  1. Ureteral Stone 592.1  Plan Pyuria (791.9)  1. URINE CULTURE  Requested for: 09Nov2012 Ureteral Stone (592.1)  2. Follow-up Schedule Surgery Office  Follow-up  Done: 09Nov2012  Discussion/Summary  Discussed with patient and wife it appears stone is adjacent to the stent on KUB, but it is a small stone. We discussed the nature, risks and benefits of several options - further imaging with CT, remove stent today/trial stone passage, or proceed to OR for cysto/right URS/laser litho (if needed)/stone basket extraction/stent placement. All questions answered. They elect to proceed to OR for above with attempted definitive management of stone. I discussed the stone is small. We may not find it on repeat URS.   Signatures Electronically signed by : Jerilee Field, M.D.; Nov 12 2010  3:26PM  Urine culture negative, 11/12/2010  I have reviewed the history and examined the patient.  There are no changes to the history and physical. Pt without dysuria or fever.  No stone passage. Date: 11/23/2010 Time: 12:10 PM Doyne Keel, MD

## 2010-11-23 ENCOUNTER — Ambulatory Visit (HOSPITAL_BASED_OUTPATIENT_CLINIC_OR_DEPARTMENT_OTHER)
Admission: RE | Admit: 2010-11-23 | Discharge: 2010-11-23 | Disposition: A | Discharge status: Home / Self Care | Payer: Medicare Other | Source: Ambulatory Visit | Attending: Urology | Admitting: Urology

## 2010-11-23 ENCOUNTER — Ambulatory Visit (HOSPITAL_BASED_OUTPATIENT_CLINIC_OR_DEPARTMENT_OTHER): Payer: Medicare Other | Admitting: Anesthesiology

## 2010-11-23 ENCOUNTER — Encounter (HOSPITAL_BASED_OUTPATIENT_CLINIC_OR_DEPARTMENT_OTHER): Payer: Self-pay | Admitting: Anesthesiology

## 2010-11-23 ENCOUNTER — Ambulatory Visit (HOSPITAL_COMMUNITY): Payer: Medicare Other

## 2010-11-23 ENCOUNTER — Encounter (HOSPITAL_BASED_OUTPATIENT_CLINIC_OR_DEPARTMENT_OTHER)
Admission: RE | Disposition: A | Discharge status: Home / Self Care | Payer: Self-pay | Source: Ambulatory Visit | Attending: Urology

## 2010-11-23 DIAGNOSIS — K219 Gastro-esophageal reflux disease without esophagitis: Secondary | ICD-10-CM | POA: Insufficient documentation

## 2010-11-23 DIAGNOSIS — Z79899 Other long term (current) drug therapy: Secondary | ICD-10-CM | POA: Insufficient documentation

## 2010-11-23 DIAGNOSIS — Z9889 Other specified postprocedural states: Secondary | ICD-10-CM

## 2010-11-23 DIAGNOSIS — N35919 Unspecified urethral stricture, male, unspecified site: Secondary | ICD-10-CM | POA: Insufficient documentation

## 2010-11-23 DIAGNOSIS — N201 Calculus of ureter: Secondary | ICD-10-CM | POA: Insufficient documentation

## 2010-11-23 DIAGNOSIS — Z7982 Long term (current) use of aspirin: Secondary | ICD-10-CM | POA: Insufficient documentation

## 2010-11-23 HISTORY — PX: STONE EXTRACTION WITH BASKET: SHX5318

## 2010-11-23 HISTORY — PX: CYSTOSCOPY WITH URETEROSCOPY: SHX5123

## 2010-11-23 LAB — POCT I-STAT 4, (NA,K, GLUC, HGB,HCT): Hemoglobin: 13.6 g/dL (ref 13.0–17.0)

## 2010-11-23 SURGERY — CYSTOSCOPY WITH URETEROSCOPY
Anesthesia: General | Site: Ureter | Laterality: Right | Wound class: Clean Contaminated

## 2010-11-23 MED ORDER — LIDOCAINE HCL (CARDIAC) 20 MG/ML IV SOLN
INTRAVENOUS | Status: DC | PRN
  Administered 2010-11-23: 40 mg via INTRAVENOUS

## 2010-11-23 MED ORDER — IOHEXOL 350 MG/ML SOLN
INTRAVENOUS | Status: DC | PRN
  Administered 2010-11-23: 50 mL via INTRAVENOUS

## 2010-11-23 MED ORDER — SULFAMETHOXAZOLE-TRIMETHOPRIM 800-160 MG PO TABS: 1.0000 | ORAL_TABLET | Freq: Every day | ORAL | Status: AC

## 2010-11-23 MED ORDER — PROMETHAZINE HCL 25 MG/ML IJ SOLN: 6.2500 mg | INTRAMUSCULAR | Status: DC | PRN

## 2010-11-23 MED ORDER — CEFAZOLIN SODIUM 1-5 GM-% IV SOLN
1.0000 g | INTRAVENOUS | Status: AC
  Administered 2010-11-23: 1 g via INTRAVENOUS

## 2010-11-23 MED ORDER — OXYCODONE-ACETAMINOPHEN 5-325 MG PO TABS: 1.0000 | ORAL_TABLET | ORAL | Status: AC | PRN

## 2010-11-23 MED ORDER — ONDANSETRON HCL 4 MG/2ML IJ SOLN
INTRAMUSCULAR | Status: DC | PRN
  Administered 2010-11-23: 4 mg via INTRAVENOUS

## 2010-11-23 MED ORDER — SODIUM CHLORIDE 0.9 % IR SOLN
Status: DC | PRN
  Administered 2010-11-23: 3000 mL

## 2010-11-23 MED ORDER — FENTANYL CITRATE 0.05 MG/ML IJ SOLN: 25.0000 ug | INTRAMUSCULAR | Status: DC | PRN

## 2010-11-23 MED ORDER — LACTATED RINGERS IV SOLN
INTRAVENOUS | Status: DC
  Administered 2010-11-23 (×2): via INTRAVENOUS

## 2010-11-23 MED ORDER — STERILE WATER FOR IRRIGATION IR SOLN
Status: DC | PRN
  Administered 2010-11-23: 10 mL

## 2010-11-23 MED ORDER — PROPOFOL 10 MG/ML IV EMUL
INTRAVENOUS | Status: DC | PRN
  Administered 2010-11-23: 50 mg via INTRAVENOUS
  Administered 2010-11-23: 100 mg via INTRAVENOUS

## 2010-11-23 MED ORDER — FENTANYL CITRATE 0.05 MG/ML IJ SOLN
INTRAMUSCULAR | Status: DC | PRN
  Administered 2010-11-23 (×2): 25 ug via INTRAVENOUS

## 2010-11-23 SURGICAL SUPPLY — 44 items
ADAPTER CATH URET PLST 4-6FR (CATHETERS) IMPLANT
ADPR CATH URET STRL DISP 4-6FR (CATHETERS)
APL SKNCLS STERI-STRIP NONHPOA (GAUZE/BANDAGES/DRESSINGS) ×2
BAG DRAIN URO-CYSTO SKYTR STRL (DRAIN) ×3 IMPLANT
BAG DRN ANRFLXCHMBR STRAP LEK (BAG) ×2
BAG DRN UROCATH (DRAIN) ×2
BAG URINE LEG 19OZ MD ST LTX (BAG) ×1 IMPLANT
BASKET LASER NITINOL 1.9FR (BASKET) ×1 IMPLANT
BASKET STNLS GEMINI 4WIRE 3FR (BASKET) IMPLANT
BASKET ZERO TIP NITINOL 2.4FR (BASKET) ×1 IMPLANT
BENZOIN TINCTURE PRP APPL 2/3 (GAUZE/BANDAGES/DRESSINGS) ×1 IMPLANT
BRUSH URET BIOPSY 3F (UROLOGICAL SUPPLIES) IMPLANT
BSKT STON RTRVL 120 1.9FR (BASKET) ×2
BSKT STON RTRVL GEM 120X11 3FR (BASKET)
BSKT STON RTRVL ZERO TP 2.4FR (BASKET) ×2
CANISTER SUCT LVC 12 LTR MEDI- (MISCELLANEOUS) ×1 IMPLANT
CATH COUDE FOLEY 2W 5CC 16FR (CATHETERS) ×1 IMPLANT
CATH INTERMIT  6FR 70CM (CATHETERS) ×1 IMPLANT
CATH URET 5FR 28IN CONE TIP (BALLOONS)
CATH URET 5FR 28IN OPEN ENDED (CATHETERS) IMPLANT
CATH URET 5FR 70CM CONE TIP (BALLOONS) IMPLANT
CLOTH BEACON ORANGE TIMEOUT ST (SAFETY) ×3 IMPLANT
DRAPE CAMERA CLOSED 9X96 (DRAPES) ×1 IMPLANT
DRSG TEGADERM 2-3/8X2-3/4 SM (GAUZE/BANDAGES/DRESSINGS) ×1 IMPLANT
ELECT REM PT RETURN 9FT ADLT (ELECTROSURGICAL)
ELECTRODE REM PT RTRN 9FT ADLT (ELECTROSURGICAL) IMPLANT
GLOVE BIO SURGEON STRL SZ7.5 (GLOVE) ×4 IMPLANT
GLOVE INDICATOR 6.5 STRL GRN (GLOVE) ×2 IMPLANT
GOWN PREVENTION PLUS LG XLONG (DISPOSABLE) ×3 IMPLANT
GOWN STRL REIN XL XLG (GOWN DISPOSABLE) ×4 IMPLANT
GUIDEWIRE 0.038 PTFE COATED (WIRE) ×3 IMPLANT
GUIDEWIRE ANG ZIPWIRE 038X150 (WIRE) IMPLANT
GUIDEWIRE STR DUAL SENSOR (WIRE) ×1 IMPLANT
HOLDER FOLEY CATH W/STRAP (MISCELLANEOUS) ×1 IMPLANT
IV NS IRRIG 3000ML ARTHROMATIC (IV SOLUTION) ×6 IMPLANT
KIT BALLIN UROMAX 15FX10 (LABEL) IMPLANT
KIT BALLN UROMAX 15FX4 (MISCELLANEOUS) IMPLANT
KIT BALLN UROMAX 26 75X4 (MISCELLANEOUS)
PACK CYSTOSCOPY (CUSTOM PROCEDURE TRAY) ×3 IMPLANT
SET HIGH PRES BAL DIL (LABEL)
SHEATH URET ACCESS 12FR/35CM (UROLOGICAL SUPPLIES) IMPLANT
SHEATH URET ACCESS 12FR/55CM (UROLOGICAL SUPPLIES) IMPLANT
STENT URET 6FRX24 CONTOUR (STENTS) ×1 IMPLANT
SYRINGE IRR TOOMEY STRL 70CC (SYRINGE) IMPLANT

## 2010-11-23 NOTE — Transfer of Care (Addendum)
Immediate Anesthesia Transfer of Care Note  Patient: [name] [name]  Procedure(s) Performed:  CYSTOSCOPY WITH URETEROSCOPY - Prostate exam under anesthesia  ; STONE EXTRACTION WITH BASKET  Patient Location: PACU  Anesthesia Type: General  Level of Consciousness: sedated  Airway & Oxygen Therapy: Patient connected to nasal cannula oxygen  Post-op Assessment: Report given to PACU RN  Post vital signs: Reviewed and stable  Complications: No apparent anesthesia complications

## 2010-11-23 NOTE — Op Note (Signed)
NAME:  [name], [name] NO.:  [id number]  MEDICAL RECORD NO.:  [id number]  LOCATION:                                 FACILITY:  PHYSICIAN:  Jerilee Field, MD   DATE OF BIRTH:  [birth year]  DATE OF PROCEDURE: DATE OF DISCHARGE:                              OPERATIVE REPORT   PREOPERATIVE DIAGNOSIS:  Right ureteral stone.  POSTOPERATIVE DIAGNOSES: 1. Right ureteral stone. 2. Urethral stricture.  PROCEDURE:  Cystoscopy with right ureteroscopy, stone basket extraction, and right ureteral stent placement.  SURGEON:  Jerilee Field, MD  TYPE OF ANESTHESIA:  General.  INDICATION FOR PROCEDURE:  [name] [name] is an 77 year old male who failed medical expulsion therapy in stone passage.  He was taken to the OR where I performed ureteroscopy, but could not get past a narrowed area of the right ureter.  I believe this was where the stone was impacted at the junction between the mid to distal ureter.  Therefore, a stent was left. A KUB was taken in the office and the stone was visible adjacent to the stent at this junction.  He was brought today for ureteroscopy with stone extraction.  We discussed the nature, risks, benefits, and alternatives to this procedure and all questions were answered.  The patient and his wife elected to proceed.  FINDINGS: Exam under anesthesia:  The penis was normal.  The testicles were normal without mass.  On digital rectal exam, the prostate was moderately enlarged without any hard area or nodule.On cystoscopy there was a narrowed strictured area in the bulb urethra. The rigid cystoscope did pass without difficulty. The bladder was inspected and noted to contain no stone.  On ureteroscopy, the stone was found at the expected location, just past that narrowed ureter in the location on the KUB.  It was grasped and an escape basket removed intact.  The urethra  had some evidence of trauma from the scope passing through the strictured area.   Therefore, I left a Foley catheter.  A ureteral stent was also left in place.  DESCRIPTION OF PROCEDURE:  After consent was obtained,  the patient brought to the operating room.  A time-out was performed to confirm the patient and procedure.  SCDs and preop antibiotics were given.  He was placed in lithotomy and prepped and draped in the usual fashion.  The rigid cystoscope was passed per urethra, then into the bladder.  The stent was grasped and removed.  A Sensor wire was advanced through the ureteral stent and coiled in the upper pole under fluoroscopic guidance. The stent was then removed and discarded.  A rigid ureteroscope was then passed adjacent to the wire.  The narrowed area of ureter as previously visualized was seen again, but now I was able to pass with the scope. Just proximal to this was a small dark stone.  The stone was snared in an Escape basket and removed intact without any difficulty.  The wire was then backloaded on the cystoscope as there were no other stones on CT scan.  A 6 x24 cm stent was advanced over the wire. The wire was removed and the stent was noted to be in good  position both fluoroscopically and cystoscopically.  The scope was then removed leaving the stent in place. A Foley catheter was then advanced into the bladder.  The balloon was inflated.  It was then seated at the bladder neck.  It was put to gravity drainage, draining clear urine.  Patient was then awakened, taken to recovery room in stable condition.  DRAINS:  6 x 24 cm right ureteral stent and Foley catheter.  SPECIMEN:  Right ureteral stone sent to Pathology for analysis.  Counts were correct.  COMPLICATIONS:  None.  DISPOSITION:  Patient stable to PACU.          ______________________________ Jerilee Field, MD     ME/MEDQ  D:  11/23/2010  T:  11/23/2010  Job:  [id number]

## 2010-11-23 NOTE — Anesthesia Postprocedure Evaluation (Signed)
  Anesthesia Post-op Note  Patient: [name] [name]  Procedure(s) Performed:  CYSTOSCOPY WITH URETEROSCOPY - Prostate exam under anesthesia  ; STONE EXTRACTION WITH BASKET  Patient Location: PACU  Anesthesia Type: General  Level of Consciousness: oriented and sedated  Airway and Oxygen Therapy: Patient Spontanous Breathing  Post-op Pain: mild  Post-op Assessment: Post-op Vital signs reviewed, Patient's Cardiovascular Status Stable, Respiratory Function Stable and Patent Airway  Post-op Vital Signs: stable  Complications: No apparent anesthesia complications

## 2010-11-23 NOTE — Progress Notes (Signed)
Foley irrigated w 120cc's ns by dr. Mena Goes.  Pt tolerated well. 1 tiny clot noted.  velcro leg holder applied to cath tubing.

## 2010-11-23 NOTE — OR Nursing (Signed)
Stent placed in right ureter by Dr. Mena Goes with string.

## 2010-11-23 NOTE — Progress Notes (Signed)
Reported to D. Tembo, RN as caregiver 

## 2010-11-23 NOTE — Anesthesia Procedure Notes (Addendum)
Procedure Name: LMA Insertion Performed by: Fordyce Lepak Pre-anesthesia Checklist: Patient identified, Emergency Drugs available, Suction available and Patient being monitored Patient Re-evaluated:Patient Re-evaluated prior to inductionOxygen Delivery Method: Circle System Utilized Preoxygenation: Pre-oxygenation with 100% oxygen Intubation Type: IV induction Ventilation: Mask ventilation without difficulty LMA: LMA inserted LMA Size: 4.0 Number of attempts: 1 Placement Confirmation: positive ETCO2 Dental Injury: Teeth and Oropharynx as per pre-operative assessment      

## 2010-11-23 NOTE — Brief Op Note (Signed)
11/23/2010  1:08 PM  PATIENT:  [name]  77 y.o. male  PRE-OPERATIVE DIAGNOSIS:  Right Ureteral Stone  POST-OPERATIVE DIAGNOSIS:  1) Right ureteral stone, 2) urethral stricture  PROCEDURE:  Procedure(s): CYSTOSCOPY WITH URETEROSCOPY STONE EXTRACTION WITH BASKET  SURGEON:  Surgeon(s): Antony Haste, MD  PHYSICIAN ASSISTANT:   ASSISTANTS: none   ANESTHESIA:   general  EBL:    BLOOD ADMINISTERED:none  DRAINS: 6 x 24 cm right ureteral stent, foley catheter LOCAL MEDICATIONS USED:  NONE  SPECIMEN:  No Specimen and Source of Specimen:  right ureteral stone  DISPOSITION OF SPECIMEN:  PATHOLOGY  COUNTS:  YES  TOURNIQUET:  * No tourniquets in log *  DICTATION: .[id number]  PLAN OF CARE: Discharge to home after PACU  PATIENT DISPOSITION:  PACU - hemodynamically stable.   Delay start of Pharmacological VTE agent (>24hrs) due to surgical blood loss or risk of bleeding:  {YES/NO/NOT APPLICABLE:20182

## 2010-11-23 NOTE — Progress Notes (Signed)
Glasses and dentures returned to pt

## 2010-11-23 NOTE — Anesthesia Preprocedure Evaluation (Addendum)
Anesthesia Evaluation  Patient identified by MRN, date of birth, ID band Patient awake    Reviewed: Allergy & Precautions, H&P , NPO status , Patient's Chart, lab work & pertinent test results, reviewed documented beta blocker date and time   Airway Mallampati: II  Neck ROM: Full    Dental  (+) Lower Dentures   Pulmonary neg pulmonary ROS,  clear to auscultation        Cardiovascular hypertension, Pt. on medications + CAD Regular Normal Denies cardiac symptoms,2-3 wks ago, cardiology office visit. Stable, no further testing   Neuro/Psych Negative Neurological ROS  Negative Psych ROS   GI/Hepatic Neg liver ROS, GERD-  Medicated and Controlled,  Endo/Other  Negative Endocrine ROS  Renal/GU Kidney stone  Genitourinary negative   Musculoskeletal   Abdominal   Peds negative pediatric ROS (+)  Hematology negative hematology ROS (+)   Anesthesia Other Findings   Reproductive/Obstetrics negative OB ROS                           Anesthesia Physical Anesthesia Plan  ASA: III  Anesthesia Plan: General   Post-op Pain Management:    Induction: Intravenous  Airway Management Planned: LMA  Additional Equipment:   Intra-op Plan:   Post-operative Plan:   Informed Consent: I have reviewed the patients History and Physical, chart, labs and discussed the procedure including the risks, benefits and alternatives for the proposed anesthesia with the patient or authorized representative who has indicated his/her understanding and acceptance.     Plan Discussed with: CRNA and Surgeon  Anesthesia Plan Comments:         Anesthesia Quick Evaluation

## 2010-11-26 ENCOUNTER — Other Ambulatory Visit: Payer: Self-pay | Admitting: Internal Medicine

## 2010-11-30 ENCOUNTER — Encounter (HOSPITAL_BASED_OUTPATIENT_CLINIC_OR_DEPARTMENT_OTHER): Payer: Self-pay | Admitting: Urology

## 2010-12-02 ENCOUNTER — Encounter (HOSPITAL_BASED_OUTPATIENT_CLINIC_OR_DEPARTMENT_OTHER): Payer: Self-pay

## 2010-12-02 ENCOUNTER — Ambulatory Visit (HOSPITAL_BASED_OUTPATIENT_CLINIC_OR_DEPARTMENT_OTHER): Admit: 2010-12-02 | Payer: Self-pay | Admitting: Surgery

## 2010-12-02 SURGERY — REPAIR, HERNIA, INGUINAL, LAPAROSCOPIC
Anesthesia: General | Laterality: Right

## 2010-12-03 ENCOUNTER — Telehealth: Payer: Self-pay

## 2010-12-03 NOTE — Telephone Encounter (Signed)
Flu Vaccine Information Date administered 12/03/2010 Left Deltoid [employer] [address] Lot Number [id number] Exp. July 03, 2011

## 2010-12-27 ENCOUNTER — Telehealth: Payer: Self-pay

## 2010-12-27 MED ORDER — TRIAMCINOLONE ACETONIDE 0.1 % EX CREA: TOPICAL_CREAM | Freq: Two times a day (BID) | CUTANEOUS | Status: DC

## 2010-12-27 NOTE — Telephone Encounter (Signed)
The patient has been hospitalized 4 times in Nov. And Dec. He has a rash where the IV was and would like something called in. He has been using benadryl but has not been working. [facility] [address] Call back number is [phone]

## 2010-12-27 NOTE — Telephone Encounter (Signed)
Patient informed. 

## 2010-12-27 NOTE — Telephone Encounter (Signed)
Ok for triam cr prn - done per [employer]

## 2011-02-01 ENCOUNTER — Ambulatory Visit (INDEPENDENT_AMBULATORY_CARE_PROVIDER_SITE_OTHER): Payer: Medicare Other | Admitting: Internal Medicine

## 2011-02-01 ENCOUNTER — Encounter: Payer: Self-pay | Admitting: Internal Medicine

## 2011-02-01 VITALS — BP 120/82 | HR 63 | Temp 97.0°F | Ht 67.0 in | Wt 126.5 lb

## 2011-02-01 DIAGNOSIS — I10 Essential (primary) hypertension: Secondary | ICD-10-CM

## 2011-02-01 DIAGNOSIS — I2510 Atherosclerotic heart disease of native coronary artery without angina pectoris: Secondary | ICD-10-CM

## 2011-02-01 DIAGNOSIS — E785 Hyperlipidemia, unspecified: Secondary | ICD-10-CM

## 2011-02-01 DIAGNOSIS — M549 Dorsalgia, unspecified: Secondary | ICD-10-CM

## 2011-02-01 NOTE — Patient Instructions (Signed)
Continue all other medications as before Please call [facility] for the update on your hernia surgury scheduling Please return in 6 months, or sooner if needed

## 2011-02-01 NOTE — Progress Notes (Signed)
Subjective:    Patient ID: [name] [name], male    DOB: 1924/08/25, 78 y.o.   MRN: [id number]  HPI  Here to f/u; overall doing ok,  Pt denies chest pain, increased sob or doe, wheezing, orthopnea, PND, increased LE swelling, palpitations, dizziness or syncope.  Pt denies new neurological symptoms such as new headache, or facial or extremity weakness or numbness   Pt denies polydipsia, polyuria.  Pt states overall good compliance with meds, trying to follow lower cholesterol diet, wt overall down 10 lbs with better diet, and some exercise however.  Pt continues to have recurring LBP without change in severity, bowel or bladder change, fever, wt loss,  worsening LE pain/numbness/weakness, gait change or falls.  Still with hernia not able to have surgury as he was side tracked by symptomatic renal stone requiring temp stent and foley. Past Medical History  Diagnosis Date  . ALLERGIC RHINITIS 05/12/2009  . BACK PAIN 01/15/2007  . BURSITIS, LEFT SHOULDER 11/01/2006  . CORONARY ARTERY DISEASE 11/01/2006  . Dizziness and giddiness 09/29/2008  . DYSLIPIDEMIA 11/01/2006  . 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 FATIGUE 06/04/2008  . GERD 01/14/2006  . GROIN STRAIN, RIGHT 12/24/2009  . HIP PAIN, CHRONIC 11/01/2006  . HYPERTENSION 11/01/2006  . LESION, FACE 09/10/2009  . LUNG NODULE 01/25/2010  . MACULAR DEGENERATION, BILATERAL 01/30/2009  . OSTEOPOROSIS 07/25/2007  . Pain in joint, shoulder region 03/04/2009  . PERIPHERAL VASCULAR DISEASE 07/25/2007  . Plantar wart 09/10/2009  . Preventative health care 07/25/2010  . RASH-NONVESICULAR 09/29/2008  . RECTAL PAIN 01/15/2007  . ROTATOR CUFF TEAR 11/01/2006  . SPONDYLOSIS, LUMBAR 11/01/2006  . SWELLING MASS OR LUMP IN HEAD AND NECK 01/29/2008  . Kidney stones     right  . Blood in urine    Past Surgical History  Procedure Date  . Cataract extraction, bilateral   . Tonsillectomy   . Inguinal herniorrhapy bilateral   . Ganglion cyst excision 1960  .  Cystoscopy with ureteroscopy 11/23/2010    Procedure: CYSTOSCOPY WITH URETEROSCOPY;  Surgeon: Antony Haste, MD;  Location: [facility];  Service: Urology;  Laterality: Right;  Prostate exam under anesthesia    . Stone extraction with basket 11/23/2010    Procedure: STONE EXTRACTION WITH BASKET;  Surgeon: Antony Haste, MD;  Location: [facility];  Service: Urology;  Laterality: Right;    reports that he has never smoked. He has never used smokeless tobacco. He reports that he drinks about .6 ounces of alcohol per week. He reports that he does not use illicit drugs. family history is not on file. Allergies  Allergen Reactions  . Clarithromycin     Flushed face  . Tadalafil     Flushed face   Current Outpatient Prescriptions on File Prior to Visit  Medication Sig Dispense Refill  . aspirin 81 MG tablet Take 81 mg by mouth daily.        . cholecalciferol (VITAMIN D) 1000 UNITS tablet Take 1,000 Units by mouth daily.        Marland Kitchen esomeprazole (NEXIUM) 40 MG capsule Take 40 mg by mouth daily.        Marland Kitchen lisinopril (PRINIVIL,ZESTRIL) 10 MG tablet Take 10 mg by mouth daily.        . nitroGLYCERIN (NITROSTAT) 0.4 MG SL tablet TAKE 1 TABLET UNDER THE TONGUE AS NEEDED(AS DIRECTED BY PHYSICIAN)  25 tablet  0  . traMADol-acetaminophen (ULTRACET) 37.5-325 MG per tablet Take 1 tablet by mouth 2 (  two) times daily as needed.        . triamcinolone cream (KENALOG) 0.1 % Apply topically 2 (two) times daily.  30 g  0  . bisacodyl (DULCOLAX) 5 MG EC tablet Take 5 mg by mouth daily as needed.        Marland Kitchen oxyCODONE-acetaminophen (ROXICET) 5-325 MG/5ML solution Take by mouth every 4 (four) hours as needed.        . rosuvastatin (CRESTOR) 20 MG tablet Take 20 mg by mouth daily.        . Tamsulosin HCl (FLOMAX) 0.4 MG CAPS Take by mouth.         Review of Systems Review of Systems  Constitutional: Negative for diaphoresis and unexpected weight change.  HENT: Negative  for drooling and tinnitus.   Eyes: Negative for photophobia and visual disturbance.  Respiratory: Negative for choking and stridor.   Gastrointestinal: Negative for vomiting and blood in stool.  Genitourinary: Negative for hematuria and decreased urine volume.    Objective:   Physical Exam BP 120/82  Pulse 63  Temp(Src) 97 F (36.1 C) (Oral)  Ht 5\' 7"  (1.702 m)  Wt 126 lb 8 oz (57.38 kg)  BMI 19.81 kg/m2  SpO2 98% Physical Exam  VS noted Constitutional: Pt appears well-developed and well-nourished.  HENT: Head: Normocephalic.  Right Ear: External ear normal.  Left Ear: External ear normal.  Eyes: Conjunctivae and EOM are normal. Pupils are equal, round, and reactive to light.  Neck: Normal range of motion. Neck supple.  Cardiovascular: Normal rate and regular rhythm.   Pulmonary/Chest: Effort normal and breath sounds normal.  Abd:  Soft, NT, non-distended, + BS Neurological: Pt is alert. No cranial nerve deficit.  Skin: Skin is warm. No erythema.  Psychiatric: Pt behavior is normal. Thought content normal.     Assessment & Plan:

## 2011-02-01 NOTE — Assessment & Plan Note (Signed)
stable overall by hx and exam, and pt to continue medical treatment as before 

## 2011-02-01 NOTE — Assessment & Plan Note (Signed)
stable overall by hx and exam, most recent data reviewed with pt, and pt to continue medical treatment as before  Lab Results  Component Value Date   LDLCALC 61 01/25/2010    

## 2011-02-01 NOTE — Assessment & Plan Note (Signed)
stable overall by hx and exam, most recent data reviewed with pt, and pt to continue medical treatment as before Lab Results  Component Value Date   WBC 8.8 11/01/2010   HGB 13.6 11/23/2010   HCT 40.0 11/23/2010   PLT 191 11/01/2010   GLUCOSE 85 11/23/2010   CHOL 144 01/25/2010   TRIG 98.0 01/25/2010   HDL 63.40 01/25/2010   LDLCALC 61 01/25/2010   ALT 21 01/25/2010   AST 23 01/25/2010   NA 141 11/23/2010   K 4.2 11/23/2010   CL 105 11/01/2010   CREATININE 1.23 11/01/2010   BUN 20 11/01/2010   CO2 25 11/01/2010   TSH 1.25 01/25/2010   HGBA1C 5.7 05/12/2009

## 2011-02-01 NOTE — Assessment & Plan Note (Signed)
stable overall by hx and exam, most recent data reviewed with pt, and pt to continue medical treatment as before  BP Readings from Last 3 Encounters:  02/01/11 120/82  11/23/10 113/67  11/23/10 113/67

## 2011-02-03 ENCOUNTER — Other Ambulatory Visit: Payer: Self-pay | Admitting: Internal Medicine

## 2011-02-03 NOTE — Telephone Encounter (Signed)
Done per emr escript

## 2011-03-08 ENCOUNTER — Ambulatory Visit (INDEPENDENT_AMBULATORY_CARE_PROVIDER_SITE_OTHER): Payer: Medicare Other | Admitting: Surgery

## 2011-03-08 ENCOUNTER — Encounter (INDEPENDENT_AMBULATORY_CARE_PROVIDER_SITE_OTHER): Payer: Self-pay | Admitting: Surgery

## 2011-03-08 VITALS — BP 122/68 | HR 68 | Temp 98.4°F | Ht 64.0 in | Wt 125.4 lb

## 2011-03-08 DIAGNOSIS — K4090 Unilateral inguinal hernia, without obstruction or gangrene, not specified as recurrent: Secondary | ICD-10-CM

## 2011-03-08 NOTE — Progress Notes (Signed)
Subjective:     Patient ID: [name] [name], male   DOB: [birth year], 78 y.o.   MRN: [id number]  HPI He is well-known to me with a history of a right inguinal hernia. Surgery had to be delayed secondary to significant problems with a kidney stone. That issue has now resolved so he wishes to proceed with hernia repair. He has had no change in his symptoms from the hernia since I saw him last. He has no obstructive symptoms.  Review of Systems     Objective:   Physical Exam On exam, he has a small, easily reducible right inguinal hernia. There is no evidence of left inguinal hernia    Assessment:     Right inguinal hernia    Plan:     Repair with mesh was recommended. I discussed this with him in detail. I will perform this open to minimize the anesthesia. I discussed the risk of surgery with him which includes but is not limited to bleeding, infection, nerve entrapment, chronic pain, recurrence, etc. He understands and wishes to proceed. Likelihood of success is good

## 2011-04-01 ENCOUNTER — Encounter (HOSPITAL_COMMUNITY): Payer: Self-pay | Admitting: Pharmacy Technician

## 2011-04-11 ENCOUNTER — Encounter (HOSPITAL_COMMUNITY): Payer: Self-pay

## 2011-04-11 ENCOUNTER — Encounter (HOSPITAL_COMMUNITY)
Admission: RE | Admit: 2011-04-11 | Discharge: 2011-04-11 | Disposition: A | Discharge status: Home / Self Care | Payer: Medicare Other | Source: Ambulatory Visit | Attending: Surgery | Admitting: Surgery

## 2011-04-11 HISTORY — DX: Personal history of other diseases of the digestive system: Z87.19

## 2011-04-11 LAB — BASIC METABOLIC PANEL
BUN: 21 mg/dL (ref 6–23)
Calcium: 9.3 mg/dL (ref 8.4–10.5)
Creatinine, Ser: 1.18 mg/dL (ref 0.50–1.35)
GFR calc Af Amer: 63 mL/min — ABNORMAL LOW (ref >90)
GFR calc non Af Amer: 54 mL/min — ABNORMAL LOW (ref >90)
Potassium: 4.3 mEq/L (ref 3.5–5.1)

## 2011-04-11 LAB — CBC
HCT: 40.3 % (ref 39.0–52.0)
MCHC: 33.7 g/dL (ref 30.0–36.0)
MCV: 91.6 fL (ref 78.0–100.0)
RDW: 12.8 % (ref 11.5–15.5)

## 2011-04-11 NOTE — Progress Notes (Signed)
Dr cooper called for stess test,ekg,echo.

## 2011-04-11 NOTE — Pre-Procedure Instructions (Signed)
20 [name] [name]  04/11/2011   Your procedure is scheduled on:  04/15/11  Report to [facility] at 530 AM.  Call this number if you have problems the morning of surgery: [phone]   Remember:   Do not eat food:After Midnight.  May have clear liquids: up to 4 Hours before arrival.  Clear liquids include soda, tea, black coffee, apple or grape juice, broth.  Take these medicines the morning of surgery with A SIP OF WATER: nexium,ultracet,ntg if needed   Do not wear jewelry, make-up or nail polish.  Do not wear lotions, powders, or perfumes. You may wear deodorant.  Do not shave 48 hours prior to surgery.  Do not bring valuables to the hospital.  Contacts, dentures or bridgework may not be worn into surgery.  Leave suitcase in the car. After surgery it may be brought to your room.  For patients admitted to the hospital, checkout time is 11:00 AM the day of discharge.   Patients discharged the day of surgery will not be allowed to drive home.  Name and phone number of your driver: family  Special Instructions: CHG Shower Use Special Wash: 1/2 bottle night before surgery and 1/2 bottle morning of surgery.   Please read over the following fact sheets that you were given: Pain Booklet, Coughing and Deep Breathing, MRSA Information and Surgical Site Infection Prevention

## 2011-04-11 NOTE — Progress Notes (Signed)
Last ov note in epic

## 2011-04-12 NOTE — Consult Note (Signed)
Anesthesia:  Patient is a 78 year old male scheduled for a right IHR with mesh on 04/15/11.  History includes non-smoker, GERD, osteoporosis, PVD, ED (organic), lumbar spondylosis, HH, non-obstructive CAD, HLD, macular degeneration, HTN.  He was initially scheduled for this procedure in November 2012, but had issues with kidney stones and ultimately required cystoscopy and right ureteroscopy for stone extraction on 11/23/10 at [facility].   His Cardiologist is Dr. Excell Seltzer who last saw him on 10/19/10 and was the one who actually referred him to [facility] for surgical repair of his hernia.  His EKG then showed NSR, right BBB.  By notes, he had nonobstructive CAD by cath several years ago in [place].  ECHO on 05/21/09 showed: - Left ventricle: Systolic function was normal. The estimated ejection fraction was in the range of 55% to 60%. Wall motion was normal; there were no regional wall motion abnormalities. Doppler parameters are consistent with abnormal left ventricular relaxation (grade 1 diastolic dysfunction). Doppler parameters are consistent with high ventricular filling pressure. - Aortic valve: Mild regurgitation. - Mitral valve: Calcified annulus. - Left atrium: The atrium was mildly dilated. - Atrial septum: There was increased thickness of the septum, consistent with lipomatous hypertrophy.  He had a normal Nuclear stress test with EF 64% on 10/14/09.  CXR on 04/11/11 showed: Moderate sized hiatal hernia noted.  Atherosclerotic calcification of the aortic arch is present.  Heart size is within normal limits.  Bilaterally symmetric nodular densities projecting over the lung bases are compatible with nipple shadows.  Mild biapical pleuroparenchymal scarring noted.  Minimal blunting of the posterior costophrenic angles appears to be chronic and may be related to large lung volumes rather than pleural effusions.   Labs acceptable.  Anticipate he can proceed if no acute change in his status.

## 2011-04-14 MED ORDER — CEFAZOLIN SODIUM 1-5 GM-% IV SOLN
1.0000 g | INTRAVENOUS | Status: AC
  Administered 2011-04-15: 1 g via INTRAVENOUS
  Filled 2011-04-14: qty 50

## 2011-04-14 NOTE — H&P (Signed)
[name] [name] is an 78 y.o. male.   Chief Complaint: right inguinal hernia HPI: pt well known to me with symptomatic right inguinal hernia  Past Medical History  Diagnosis Date  . ALLERGIC RHINITIS 05/12/2009  . BACK PAIN 01/15/2007  . BURSITIS, LEFT SHOULDER 11/01/2006  . CORONARY ARTERY DISEASE 11/01/2006  . Dizziness and giddiness 09/29/2008  . DYSLIPIDEMIA 11/01/2006  . 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 FATIGUE 06/04/2008  . GERD 01/14/2006  . GROIN STRAIN, RIGHT 12/24/2009  . HIP PAIN, CHRONIC 11/01/2006  . LESION, FACE 09/10/2009  . LUNG NODULE 01/25/2010  . MACULAR DEGENERATION, BILATERAL 01/30/2009  . OSTEOPOROSIS 07/25/2007  . Pain in joint, shoulder region 03/04/2009  . PERIPHERAL VASCULAR DISEASE 07/25/2007  . Plantar wart 09/10/2009  . Preventative health care 07/25/2010  . RASH-NONVESICULAR 09/29/2008  . RECTAL PAIN 01/15/2007  . ROTATOR CUFF TEAR 11/01/2006  . SPONDYLOSIS, LUMBAR 11/01/2006  . SWELLING MASS OR LUMP IN HEAD AND NECK 01/29/2008  . Kidney stones     right  . Blood in urine   . H/O hiatal hernia   . HYPERTENSION 11/01/2006    dr cooper    Past Surgical History  Procedure Date  . Cataract extraction, bilateral   . Tonsillectomy   . Inguinal herniorrhapy bilateral   . Ganglion cyst excision 1960  . Cystoscopy with ureteroscopy 11/23/2010    Procedure: CYSTOSCOPY WITH URETEROSCOPY;  Surgeon: Antony Haste, MD;  Location: [facility];  Service: Urology;  Laterality: Right;  Prostate exam under anesthesia    . Stone extraction with basket 11/23/2010    Procedure: STONE EXTRACTION WITH BASKET;  Surgeon: Antony Haste, MD;  Location: [facility];  Service: Urology;  Laterality: Right;  . Hernia repair   . Cardiac catheterization     06     [facility].  . Eye surgery     No family history on file. Social History:  reports that he has never smoked. He has never used smokeless tobacco. He reports that he  drinks about .6 ounces of alcohol per week. He reports that he does not use illicit drugs.  Allergies:  Allergies  Allergen Reactions  . Clarithromycin     Flushed face  . Tadalafil     Flushed face    Medications Prior to Admission  Medication Dose Route Frequency Provider Last Rate Last Dose  . ceFAZolin (ANCEF) IVPB 1 g/50 mL premix  1 g Intravenous 60 min Pre-Op Shelly Rubenstein, MD       Medications Prior to Admission  Medication Sig Dispense Refill  . aspirin 81 MG tablet Take 81 mg by mouth daily.        . cholecalciferol (VITAMIN D) 1000 UNITS tablet Take 1,000 Units by mouth daily.          No results found for this or any previous visit (from the past 48 hour(s)). No results found.  Review of Systems  All other systems reviewed and are negative.    There were no vitals taken for this visit. Physical Exam  Constitutional: He is oriented to person, place, and time. He appears well-developed and well-nourished. No distress.  Neck: Normal range of motion. Neck supple.  Cardiovascular: Normal rate and regular rhythm.   Respiratory: Effort normal and breath sounds normal.  GI: Soft. Bowel sounds are normal.       Right inguinal hernia  Musculoskeletal: Normal range of motion.  Neurological: He is alert and oriented to person,  place, and time.  Skin: Skin is warm and dry.  Psychiatric: His behavior is normal. Judgment normal.     Assessment/Plan Right inguinal hernia  Plan open repair with mesh.  Risks discussed in detail.  Likelihood of success is good  [name] [name] A 04/14/2011, 8:00 PM

## 2011-04-15 ENCOUNTER — Ambulatory Visit (HOSPITAL_COMMUNITY): Payer: Medicare Other | Admitting: Vascular Surgery

## 2011-04-15 ENCOUNTER — Encounter (HOSPITAL_COMMUNITY): Payer: Self-pay | Admitting: Vascular Surgery

## 2011-04-15 ENCOUNTER — Ambulatory Visit (HOSPITAL_COMMUNITY)
Admission: RE | Admit: 2011-04-15 | Discharge: 2011-04-15 | Disposition: A | Discharge status: Home / Self Care | Payer: Medicare Other | Source: Ambulatory Visit | Attending: Surgery | Admitting: Surgery

## 2011-04-15 ENCOUNTER — Encounter (HOSPITAL_COMMUNITY)
Admission: RE | Disposition: A | Discharge status: Home / Self Care | Payer: Self-pay | Source: Ambulatory Visit | Attending: Surgery

## 2011-04-15 ENCOUNTER — Encounter (HOSPITAL_COMMUNITY): Payer: Self-pay | Admitting: *Deleted

## 2011-04-15 DIAGNOSIS — Z01812 Encounter for preprocedural laboratory examination: Secondary | ICD-10-CM | POA: Insufficient documentation

## 2011-04-15 DIAGNOSIS — K219 Gastro-esophageal reflux disease without esophagitis: Secondary | ICD-10-CM | POA: Insufficient documentation

## 2011-04-15 DIAGNOSIS — K4090 Unilateral inguinal hernia, without obstruction or gangrene, not specified as recurrent: Secondary | ICD-10-CM

## 2011-04-15 DIAGNOSIS — I10 Essential (primary) hypertension: Secondary | ICD-10-CM | POA: Insufficient documentation

## 2011-04-15 DIAGNOSIS — Z01818 Encounter for other preprocedural examination: Secondary | ICD-10-CM | POA: Insufficient documentation

## 2011-04-15 DIAGNOSIS — Z01810 Encounter for preprocedural cardiovascular examination: Secondary | ICD-10-CM | POA: Insufficient documentation

## 2011-04-15 HISTORY — PX: INGUINAL HERNIA REPAIR: SHX194

## 2011-04-15 SURGERY — REPAIR, HERNIA, INGUINAL, ADULT
Anesthesia: General | Site: Groin | Laterality: Right | Wound class: Clean

## 2011-04-15 MED ORDER — FENTANYL CITRATE 0.05 MG/ML IJ SOLN
INTRAMUSCULAR | Status: DC | PRN
  Administered 2011-04-15: 50 ug via INTRAVENOUS
  Administered 2011-04-15: 100 ug via INTRAVENOUS

## 2011-04-15 MED ORDER — ONDANSETRON HCL 4 MG/2ML IJ SOLN
INTRAMUSCULAR | Status: DC | PRN
  Administered 2011-04-15: 4 mg via INTRAVENOUS

## 2011-04-15 MED ORDER — 0.9 % SODIUM CHLORIDE (POUR BTL) OPTIME
TOPICAL | Status: DC | PRN
  Administered 2011-04-15: 1000 mL

## 2011-04-15 MED ORDER — LIDOCAINE HCL (CARDIAC) 20 MG/ML IV SOLN
INTRAVENOUS | Status: DC | PRN
  Administered 2011-04-15: 50 mg via INTRAVENOUS

## 2011-04-15 MED ORDER — PROPOFOL 10 MG/ML IV EMUL
INTRAVENOUS | Status: DC | PRN
  Administered 2011-04-15: 150 mg via INTRAVENOUS

## 2011-04-15 MED ORDER — HYDROMORPHONE HCL PF 1 MG/ML IJ SOLN: 0.2500 mg | INTRAMUSCULAR | Status: DC | PRN

## 2011-04-15 MED ORDER — LACTATED RINGERS IV SOLN
INTRAVENOUS | Status: DC | PRN
  Administered 2011-04-15 (×2): via INTRAVENOUS

## 2011-04-15 MED ORDER — BUPIVACAINE LIPOSOME 1.3 % IJ SUSP
20.0000 mL | INTRAMUSCULAR | Status: AC
  Administered 2011-04-15: 20 mL
  Filled 2011-04-15: qty 20

## 2011-04-15 MED ORDER — ARTIFICIAL TEARS OP OINT
TOPICAL_OINTMENT | OPHTHALMIC | Status: DC | PRN
  Administered 2011-04-15: 1 via OPHTHALMIC

## 2011-04-15 MED ORDER — EPHEDRINE SULFATE 50 MG/ML IJ SOLN
INTRAMUSCULAR | Status: DC | PRN
  Administered 2011-04-15: 10 mg via INTRAVENOUS
  Administered 2011-04-15: 5 mg via INTRAVENOUS
  Administered 2011-04-15: 10 mg via INTRAVENOUS

## 2011-04-15 MED ORDER — HYDROCODONE-ACETAMINOPHEN 5-325 MG PO TABS: 1.0000 | ORAL_TABLET | ORAL | Status: AC | PRN

## 2011-04-15 SURGICAL SUPPLY — 47 items
APL SKNCLS STERI-STRIP NONHPOA (GAUZE/BANDAGES/DRESSINGS) ×2
BENZOIN TINCTURE PRP APPL 2/3 (GAUZE/BANDAGES/DRESSINGS) ×3 IMPLANT
BLADE SURG 10 STRL SS (BLADE) ×3 IMPLANT
BLADE SURG 15 STRL LF DISP TIS (BLADE) ×2 IMPLANT
BLADE SURG 15 STRL SS (BLADE) ×3
BLADE SURG ROTATE 9660 (MISCELLANEOUS) ×1 IMPLANT
CHLORAPREP W/TINT 26ML (MISCELLANEOUS) ×3 IMPLANT
CLOTH BEACON ORANGE TIMEOUT ST (SAFETY) ×3 IMPLANT
COVER SURGICAL LIGHT HANDLE (MISCELLANEOUS) ×3 IMPLANT
DRAIN PENROSE 1/2X12 LTX STRL (WOUND CARE) ×1 IMPLANT
DRAPE LAPAROTOMY TRNSV 102X78 (DRAPE) ×3 IMPLANT
DRESSING TELFA 8X3 (GAUZE/BANDAGES/DRESSINGS) ×3 IMPLANT
DRSG TEGADERM 4X4.75 (GAUZE/BANDAGES/DRESSINGS) ×3 IMPLANT
ELECT CAUTERY BLADE 6.4 (BLADE) ×3 IMPLANT
ELECT REM PT RETURN 9FT ADLT (ELECTROSURGICAL) ×3
ELECTRODE REM PT RTRN 9FT ADLT (ELECTROSURGICAL) ×2 IMPLANT
GLOVE BIO SURGEON STRL SZ7 (GLOVE) ×1 IMPLANT
GLOVE BIO SURGEON STRL SZ7.5 (GLOVE) ×1 IMPLANT
GLOVE BIOGEL PI IND STRL 7.0 (GLOVE) IMPLANT
GLOVE BIOGEL PI IND STRL 7.5 (GLOVE) IMPLANT
GLOVE BIOGEL PI INDICATOR 7.0 (GLOVE) ×1
GLOVE BIOGEL PI INDICATOR 7.5 (GLOVE) ×1
GLOVE SURG SIGNA 7.5 PF LTX (GLOVE) ×3 IMPLANT
GOWN PREVENTION PLUS XLARGE (GOWN DISPOSABLE) ×3 IMPLANT
GOWN STRL NON-REIN LRG LVL3 (GOWN DISPOSABLE) ×4 IMPLANT
KIT BASIN OR (CUSTOM PROCEDURE TRAY) ×3 IMPLANT
KIT ROOM TURNOVER OR (KITS) ×3 IMPLANT
MESH PARIETEX PROGRIP RIGHT (Mesh General) ×1 IMPLANT
NDL HYPO 25GX1X1/2 BEV (NEEDLE) ×2 IMPLANT
NEEDLE HYPO 25GX1X1/2 BEV (NEEDLE) ×3 IMPLANT
NS IRRIG 1000ML POUR BTL (IV SOLUTION) ×3 IMPLANT
PACK SURGICAL SETUP 50X90 (CUSTOM PROCEDURE TRAY) ×3 IMPLANT
PAD ARMBOARD 7.5X6 YLW CONV (MISCELLANEOUS) ×6 IMPLANT
PENCIL BUTTON HOLSTER BLD 10FT (ELECTRODE) ×3 IMPLANT
SPECIMEN JAR SMALL (MISCELLANEOUS) IMPLANT
SPONGE LAP 18X18 X RAY DECT (DISPOSABLE) ×3 IMPLANT
STRIP CLOSURE SKIN 1/2X4 (GAUZE/BANDAGES/DRESSINGS) ×3 IMPLANT
SUT MON AB 4-0 PC3 18 (SUTURE) ×3 IMPLANT
SUT SILK 2 0 SH (SUTURE) IMPLANT
SUT VIC AB 2-0 CT1 27 (SUTURE) ×6
SUT VIC AB 2-0 CT1 TAPERPNT 27 (SUTURE) ×4 IMPLANT
SUT VIC AB 3-0 CT1 27 (SUTURE) ×3
SUT VIC AB 3-0 CT1 TAPERPNT 27 (SUTURE) ×2 IMPLANT
SYR CONTROL 10ML LL (SYRINGE) ×3 IMPLANT
TOWEL OR 17X24 6PK STRL BLUE (TOWEL DISPOSABLE) ×3 IMPLANT
TOWEL OR 17X26 10 PK STRL BLUE (TOWEL DISPOSABLE) ×3 IMPLANT
WATER STERILE IRR 1000ML POUR (IV SOLUTION) IMPLANT

## 2011-04-15 NOTE — Anesthesia Preprocedure Evaluation (Signed)
Anesthesia Evaluation  Patient identified by MRN, date of birth, ID band Patient awake    Reviewed: Allergy & Precautions, H&P , NPO status , Patient's Chart, lab work & pertinent test results  History of Anesthesia Complications Negative for: history of anesthetic complications  Airway Mallampati: II TM Distance: >3 FB Neck ROM: Full    Dental No notable dental hx. (+) Teeth Intact, Partial Lower and Dental Advisory Given   Pulmonary neg pulmonary ROS,  breath sounds clear to auscultation  Pulmonary exam normal       Cardiovascular hypertension, + CAD (cath 5 years ago:non obstructive, stress test '11: no ischemia, ECHO '11: normal LVF) Rhythm:Regular Rate:Normal     Neuro/Psych negative neurological ROS     GI/Hepatic Neg liver ROS, GERD-  Controlled and Medicated,  Endo/Other  negative endocrine ROS  Renal/GU negative Renal ROS     Musculoskeletal   Abdominal   Peds  Hematology negative hematology ROS (+)   Anesthesia Other Findings   Reproductive/Obstetrics                           Anesthesia Physical Anesthesia Plan  ASA: III  Anesthesia Plan: General   Post-op Pain Management:    Induction: Intravenous  Airway Management Planned: LMA  Additional Equipment:   Intra-op Plan:   Post-operative Plan:   Informed Consent: I have reviewed the patients History and Physical, chart, labs and discussed the procedure including the risks, benefits and alternatives for the proposed anesthesia with the patient or authorized representative who has indicated his/her understanding and acceptance.   Dental advisory given  Plan Discussed with: Surgeon and CRNA  Anesthesia Plan Comments: (Plan routine monitors, GA- LMA OK)        Anesthesia Quick Evaluation

## 2011-04-15 NOTE — Transfer of Care (Signed)
Immediate Anesthesia Transfer of Care Note  Patient: [name] [name]  Procedure(s) Performed: Procedure(s) (LRB): HERNIA REPAIR INGUINAL ADULT (Right) INSERTION OF MESH (N/A)  Patient Location: PACU  Anesthesia Type: General  Level of Consciousness: awake, alert  and oriented  Airway & Oxygen Therapy: Patient Spontanous Breathing and Patient connected to nasal cannula oxygen  Post-op Assessment: Report given to PACU RN, Post -op Vital signs reviewed and stable and Patient moving all extremities  Post vital signs: Reviewed and stable  Complications: No apparent anesthesia complications

## 2011-04-15 NOTE — Anesthesia Procedure Notes (Signed)
Procedure Name: LMA Insertion Date/Time: 04/15/2011 7:38 AM Performed by: Luster Landsberg Pre-anesthesia Checklist: Patient identified, Emergency Drugs available, Suction available and Patient being monitored Patient Re-evaluated:Patient Re-evaluated prior to inductionOxygen Delivery Method: Circle system utilized Preoxygenation: Pre-oxygenation with 100% oxygen Intubation Type: IV induction Ventilation: Mask ventilation without difficulty LMA: LMA inserted LMA Size: 4.0 Tube type: Oral Number of attempts: 1 Placement Confirmation: positive ETCO2 and breath sounds checked- equal and bilateral Tube secured with: Tape Dental Injury: Teeth and Oropharynx as per pre-operative assessment

## 2011-04-15 NOTE — Op Note (Signed)
HERNIA REPAIR INGUINAL ADULT, INSERTION OF MESH  Procedure Note  [name] [name] 04/15/2011   Pre-op Diagnosis: right inguinal hernia     Post-op Diagnosis: same  Procedure(s): HERNIA REPAIR INGUINAL ADULT INSERTION OF MESH  Surgeon(s): Shelly Rubenstein, MD  Anesthesia: General  Staff:  Doy Mince, RN - Circulator Janeece Agee Pingue, CST - Scrub Person Gerre Pebbles Sipsis, RN - Scrub Person Gerre Pebbles Sipsis, RN - Careers adviser  Estimated Blood Loss: Minimal               Procedure: The patient was brought to the operating room and identified as the correct patient. He was placed supine on the operating room table and general anesthesia was induced. His abdomen was then prepped and draped in the usual sterile fashion. I injected lidocaine in the right lower quadrant and performed an ilioinguinal nerve block. I then made a longitudinal incision with the scalpel. I took this down through Scarpa's fascia with the electrocautery. I then identified the external oblique fascia and opened this the internal and external rings. The testicular cord structures were easily identified and controlled a Penrose drain. The patient had a direct hernia defect. I was able to easily reduce the sac. There was no evidence of indirect hernia. I brought a piece of pro-grip Prolene mesh onto the field. I placed around the cord structures and secured nicely to the inguinal floor. I then used one single 2-0 Vicryl suture to secure it to the pubic tubercle. Good coverage of the hernia defect appeared to be achieved and the mesh appeared secured in place. I then closed external oblique fascia over the top of this with a running 2-0 Vicryl suture. I anesthetized the fascia further with bupivacaine. I then closed the subcutaneous tissue with interrupted 3-0 Vicryl sutures and closed the skin with a running 4-0 Monocryl suture. Steri-Strips gauze and Tegaderm were applied. The patient  tolerated the procedure well. All the counts were correct at the end of the procedure. The patient was then extubated in the operating room and taken in stable condition to the recovery room.          [name]   Date: 04/15/2011  Time: 8:03 AM

## 2011-04-15 NOTE — Interval H&P Note (Signed)
History and Physical Interval Note:  He has had no change in his history or exam  04/15/2011 6:54 AM  [name] [name]  has presented today for surgery, with the diagnosis of right inguinal hernia  The various methods of treatment have been discussed with the patient and family. After consideration of risks, benefits and other options for treatment, the patient has consented to  Procedure(s) (LRB): HERNIA REPAIR INGUINAL ADULT (Right) INSERTION OF MESH (N/A) as a surgical intervention .  The patients' history has been reviewed, patient examined, no change in status, stable for surgery.  I have reviewed the patients' chart and labs.  Questions were answered to the patient's satisfaction.     [name]

## 2011-04-15 NOTE — Discharge Instructions (Signed)
[facility] _______[facility] ° °UMBILICAL OR INGUINAL HERNIA REPAIR: POST OP INSTRUCTIONS ° °Always review your discharge instruction sheet given to you by the facility where your surgery was performed. °IF YOU HAVE DISABILITY OR FAMILY LEAVE FORMS, YOU MUST BRING THEM TO THE OFFICE FOR PROCESSING.   °DO NOT GIVE THEM TO YOUR DOCTOR. ° °1. A  prescription for pain medication may be given to you upon discharge.  Take your pain medication as prescribed, if needed.  If narcotic pain medicine is not needed, then you may take acetaminophen (Tylenol) or ibuprofen (Advil) as needed. °2. Take your usually prescribed medications unless otherwise directed. °3. If you need a refill on your pain medication, please contact your pharmacy.  They will contact our office to request authorization. Prescriptions will not be filled after 5 pm or on week-ends. °4. You should follow a light diet the first 24 hours after arrival home, such as soup and crackers, etc.  Be sure to include lots of fluids daily.  Resume your normal diet the day after surgery. °5. Most patients will experience some swelling and bruising around the umbilicus or in the groin and scrotum.  Ice packs and reclining will help.  Swelling and bruising can take several days to resolve.  °6. It is common to experience some constipation if taking pain medication after surgery.  Increasing fluid intake and taking a stool softener (such as Colace) will usually help or prevent this problem from occurring.  A mild laxative (Milk of Magnesia or Miralax) should be taken according to package directions if there are no bowel movements after 48 hours. °7. Unless discharge instructions indicate otherwise, you may remove your bandages 24-48 hours after surgery, and you may shower at that time.  You may have steri-strips (small skin tapes) in place directly over the incision.  These strips should be left on the skin for 7-10 days.  If your surgeon used skin glue on the  incision, you may shower in 24 hours.  The glue will flake off over the next 2-3 weeks.  Any sutures or staples will be removed at the office during your follow-up visit. °8. ACTIVITIES:  You may resume regular (light) daily activities beginning the next day--such as daily self-care, walking, climbing stairs--gradually increasing activities as tolerated.  You may have sexual intercourse when it is comfortable.  Refrain from any heavy lifting or straining until approved by your doctor. °a. You may drive when you are no longer taking prescription pain medication, you can comfortably wear a seatbelt, and you can safely maneuver your car and apply brakes. °b. RETURN TO WORK:  __________________________________________________________ °9. You should see your doctor in the office for a follow-up appointment approximately 2-3 weeks after your surgery.  Make sure that you call for this appointment within a day or two after you arrive home to insure a convenient appointment time. °10. OTHER INSTRUCTIONS: No lifting more than 15 pounds for 4 weeks __________________________________________________________________________________________________________________________________________________________________________________________  °WHEN TO CALL YOUR DOCTOR: °1. Fever over 101.0 °2. Inability to urinate °3. Nausea and/or vomiting °4. Extreme swelling or bruising °5. Continued bleeding from incision. °6. Increased pain, redness, or drainage from the incision ° °The clinic staff is available to answer your questions during regular business hours.  Please don’t hesitate to call and ask to speak to one of the nurses for clinical concerns.  If you have a medical emergency, go to the nearest emergency room or call 911.  A surgeon from [facility] is   always on call at the hospital ° ° °[address], [address], [place], [place]  [address] ? ° [address] [address], [place], [place]   [address] °[phone] ? [phone] ?  FAX [phone] °Web site: [email or web address] °

## 2011-04-15 NOTE — Anesthesia Postprocedure Evaluation (Signed)
  Anesthesia Post-op Note  Patient: [name] [name]  Procedure(s) Performed: Procedure(s) (LRB): HERNIA REPAIR INGUINAL ADULT (Right) INSERTION OF MESH (N/A)  Patient Location: PACU  Anesthesia Type: General  Level of Consciousness: awake, alert  and oriented  Airway and Oxygen Therapy: Patient Spontanous Breathing  Post-op Pain: none  Post-op Assessment: Post-op Vital signs reviewed, Patient's Cardiovascular Status Stable, Respiratory Function Stable, Patent Airway, No signs of Nausea or vomiting and Pain level controlled  Post-op Vital Signs: Reviewed and stable  Complications: No apparent anesthesia complications

## 2011-04-18 ENCOUNTER — Encounter (HOSPITAL_COMMUNITY): Payer: Self-pay | Admitting: Surgery

## 2011-05-03 ENCOUNTER — Encounter (INDEPENDENT_AMBULATORY_CARE_PROVIDER_SITE_OTHER): Payer: Self-pay | Admitting: Surgery

## 2011-05-03 ENCOUNTER — Ambulatory Visit (INDEPENDENT_AMBULATORY_CARE_PROVIDER_SITE_OTHER): Payer: Medicare Other | Admitting: Surgery

## 2011-05-03 VITALS — BP 132/72 | HR 78 | Resp 16 | Ht 67.5 in | Wt 125.4 lb

## 2011-05-03 DIAGNOSIS — Z09 Encounter for follow-up examination after completed treatment for conditions other than malignant neoplasm: Secondary | ICD-10-CM

## 2011-05-03 NOTE — Progress Notes (Signed)
Subjective:     Patient ID: [name] [name], male   DOB: Feb 12, 1924, 78 y.o.   MRN: [id number]  HPI He is here for his first postop visit status post right inguinal hernia repair with mesh. He is doing well and has no complaints.  Review of Systems     Objective:   Physical Exam His incision is healing well and there is no evidence of recurrence or infection    Assessment:     Patient doing well status post right inguinal hernia repair with mesh    Plan:     He will refrain from heavy lifting for one more week. I will see him back as needed

## 2011-06-08 ENCOUNTER — Other Ambulatory Visit: Payer: Self-pay | Admitting: Internal Medicine

## 2011-08-03 ENCOUNTER — Ambulatory Visit: Payer: Medicare Other | Admitting: Internal Medicine

## 2011-08-03 ENCOUNTER — Other Ambulatory Visit: Payer: Self-pay

## 2011-08-03 MED ORDER — NITROGLYCERIN 0.4 MG SL SUBL: 0.4000 mg | SUBLINGUAL_TABLET | SUBLINGUAL | Status: DC | PRN

## 2011-08-10 ENCOUNTER — Ambulatory Visit (INDEPENDENT_AMBULATORY_CARE_PROVIDER_SITE_OTHER): Payer: Medicare Other | Admitting: Internal Medicine

## 2011-08-10 ENCOUNTER — Encounter: Payer: Self-pay | Admitting: Internal Medicine

## 2011-08-10 VITALS — BP 132/78 | HR 74 | Temp 97.2°F | Ht 67.0 in | Wt 125.5 lb

## 2011-08-10 DIAGNOSIS — I10 Essential (primary) hypertension: Secondary | ICD-10-CM

## 2011-08-10 DIAGNOSIS — M545 Low back pain, unspecified: Secondary | ICD-10-CM | POA: Insufficient documentation

## 2011-08-10 DIAGNOSIS — G8929 Other chronic pain: Secondary | ICD-10-CM

## 2011-08-10 DIAGNOSIS — E785 Hyperlipidemia, unspecified: Secondary | ICD-10-CM

## 2011-08-10 MED ORDER — TRAMADOL HCL 50 MG PO TABS: 50.0000 mg | ORAL_TABLET | Freq: Four times a day (QID) | ORAL | Status: DC | PRN

## 2011-08-10 NOTE — Patient Instructions (Signed)
Ok to stop the [employer] Take all new medications as prescribed   - the tramadol at 50 mg every 6 hrs if needed Continue all other medications as before Please have the pharmacy call with any refills you may need. No further lab work needed today Please return in 6 months, or sooner if needed

## 2011-08-10 NOTE — Assessment & Plan Note (Signed)
stable overall by hx and exam, most recent data reviewed with pt, and pt to continue medical treatment as before Lab Results  Component Value Date   LDLCALC 61 01/25/2010   To cont diet and tx

## 2011-08-10 NOTE — Progress Notes (Signed)
Subjective:    Patient ID: [name] [name], male    DOB: 04/17/1924, 78 y.o.   MRN: [id number]  HPI  Here to f/u;  Overall doing ok, wt is down to 126 from usual about 135 around the time of recent [facility] repair, assoc with some decrased appetitie but thinks it might be coming back, and having the chronic LBP.  Pt continues to have recurring LBP without change in severity, bowel or bladder change, fever, wt loss,  worsening LE pain/numbness/weakness, gait change or falls.  Lower dose ultracet now not working well enough , worse at night, cant sleep well as he has marked difficulty with standing up straight as well as turning of over in bed.  Thinks may be related to [employer] service with an injury at jump school as well as lifting heavy things in [place], hospd for 10 days at one point in [place].  Was exposed to agent orange and now qualifies for [employer] services based on heart disease, but curiously not his back.  Last MRI and ortho over a yr.  Now can stand only 30 min before has to sit, initially better but then has to stand from sitting after one hr due to increased pain at that point.  Also requests viagra, but also take  NTG occasionally (none in past 2 mo).  No recent falls.  Pt denies chest pain, increased sob or doe, wheezing, orthopnea, PND, increased LE swelling, palpitations, dizziness or syncope.  Trying to follow lower chol diet.  Past Medical History  Diagnosis Date  . ALLERGIC RHINITIS 05/12/2009  . BACK PAIN 01/15/2007  . BURSITIS, LEFT SHOULDER 11/01/2006  . CORONARY ARTERY DISEASE 11/01/2006  . Dizziness and giddiness 09/29/2008  . DYSLIPIDEMIA 11/01/2006  . 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 FATIGUE 06/04/2008  . GERD 01/14/2006  . GROIN STRAIN, RIGHT 12/24/2009  . HIP PAIN, CHRONIC 11/01/2006  . LESION, FACE 09/10/2009  . LUNG NODULE 01/25/2010  . MACULAR DEGENERATION, BILATERAL 01/30/2009  . OSTEOPOROSIS 07/25/2007  . Pain in joint, shoulder region 03/04/2009  . PERIPHERAL  VASCULAR DISEASE 07/25/2007  . Plantar wart 09/10/2009  . Preventative health care 07/25/2010  . RASH-NONVESICULAR 09/29/2008  . RECTAL PAIN 01/15/2007  . ROTATOR CUFF TEAR 11/01/2006  . SPONDYLOSIS, LUMBAR 11/01/2006  . SWELLING MASS OR LUMP IN HEAD AND NECK 01/29/2008  . Kidney stones     right  . Blood in urine   . H/O hiatal hernia   . HYPERTENSION 11/01/2006    dr cooper   Past Surgical History  Procedure Date  . Cataract extraction, bilateral   . Tonsillectomy   . Inguinal herniorrhapy bilateral   . Ganglion cyst excision 1960  . Cystoscopy with ureteroscopy 11/23/2010    Procedure: CYSTOSCOPY WITH URETEROSCOPY;  Surgeon: Antony Haste, MD;  Location: [facility];  Service: Urology;  Laterality: Right;  Prostate exam under anesthesia    . Stone extraction with basket 11/23/2010    Procedure: STONE EXTRACTION WITH BASKET;  Surgeon: Antony Haste, MD;  Location: [facility];  Service: Urology;  Laterality: Right;  . Hernia repair   . Cardiac catheterization     06     [facility].  . Eye surgery   . Inguinal hernia repair 04/15/2011    Procedure: HERNIA REPAIR INGUINAL ADULT;  Surgeon: Shelly Rubenstein, MD;  Location: [facility] OR;  Service: General;  Laterality: Right;  right inguinal hernia repair with mesh    reports that he has  never smoked. He has never used smokeless tobacco. He reports that he drinks about .6 ounces of alcohol per week. He reports that he does not use illicit drugs. family history is not on file. Allergies  Allergen Reactions  . Aciphex (Rabeprazole)   . Clarithromycin     Flushed face  . Protonix (Pantoprazole Sodium)   . Tadalafil     Flushed face   Current Outpatient Prescriptions on File Prior to Visit  Medication Sig Dispense Refill  . aspirin 81 MG tablet Take 81 mg by mouth daily.        . cholecalciferol (VITAMIN D) 1000 UNITS tablet Take 1,000 Units by mouth daily.        . CRESTOR 20 MG tablet TAKE 1  TABLET BY MOUTH EVERY DAY  90 tablet  1  . esomeprazole (NEXIUM) 40 MG capsule Take 40 mg by mouth daily before breakfast.      . lisinopril (PRINIVIL,ZESTRIL) 10 MG tablet Take 10 mg by mouth daily.      . Multiple Vitamins-Minerals (CENTRUM SILVER PO) Take 1 tablet by mouth daily.      . nitroGLYCERIN (NITROSTAT) 0.4 MG SL tablet Place 1 tablet (0.4 mg total) under the tongue every 5 (five) minutes as needed. For chest pain  25 tablet  6  . traMADol-acetaminophen (ULTRACET) 37.5-325 MG per tablet Take 1 tablet by mouth daily.       Review of Systems Review of Systems  Constitutional: Negative for diaphoresis  HENT: Negative for tinnitus Eyes: Negative for photophobia and visual disturbance.  Respiratory: Negative for choking and stridor.   Gastrointestinal: Negative for vomiting and blood in stool.  Genitourinary: Negative for hematuria and decreased urine volume.  Musculoskeletal: Neg for acute joint swelling Skin: Negative for color change and wound.  Neurological: Negative for tremors and numbness.  Psychiatric/Behavioral: Negative for decreased concentration. The patient is not hyperactive.       Objective:   Physical Exam BP 132/78  Pulse 74  Temp 97.2 F (36.2 C) (Oral)  Ht 5\' 7"  (1.702 m)  Wt 125 lb 8 oz (56.926 kg)  BMI 19.66 kg/m2  SpO2 98% Physical Exam  VS noted Constitutional: Pt appears well-developed and well-nourished.  HENT: Head: Normocephalic.  Right Ear: External ear normal.  Left Ear: External ear normal.  Eyes: Conjunctivae and EOM are normal. Pupils are equal, round, and reactive to light.  Neck: Normal range of motion. Neck supple.  Cardiovascular: Normal rate and regular rhythm.   Pulmonary/Chest: Effort normal and breath sounds normal.  Abd:  Soft, NT, non-distended, + BS Neurological: Pt is alert. Not confused, motor/gait intact for age, has somewhat diminshed right patellar reflex Skin: Skin is warm. No erythema.  Spine with  kyphosis Psychiatric: Pt behavior is normal. Thought content normal.     Assessment & Plan:

## 2011-08-10 NOTE — Assessment & Plan Note (Signed)
stable overall by hx and exam, most recent data reviewed with pt, and pt to continue medical treatment as before BP Readings from Last 3 Encounters:  08/10/11 132/78  05/03/11 132/72  04/15/11 122/57

## 2011-08-10 NOTE — Assessment & Plan Note (Signed)
With known lumbar disc dz, spondylosis - exam stable, ok to incr the tramadol to 50 qid prn, consider ortho f/u if exam worsens or tends to fall

## 2011-10-05 ENCOUNTER — Other Ambulatory Visit: Payer: Self-pay | Admitting: Internal Medicine

## 2011-10-07 ENCOUNTER — Other Ambulatory Visit: Payer: Self-pay | Admitting: Internal Medicine

## 2011-10-26 ENCOUNTER — Ambulatory Visit (INDEPENDENT_AMBULATORY_CARE_PROVIDER_SITE_OTHER): Payer: Medicare Other | Admitting: Cardiovascular Disease

## 2011-10-26 ENCOUNTER — Encounter: Payer: Self-pay | Admitting: Cardiovascular Disease

## 2011-10-26 VITALS — BP 116/70 | HR 78 | Ht 67.0 in | Wt 125.0 lb

## 2011-10-26 DIAGNOSIS — I2510 Atherosclerotic heart disease of native coronary artery without angina pectoris: Secondary | ICD-10-CM

## 2011-10-26 MED ORDER — SILDENAFIL CITRATE 100 MG PO TABS: 100.0000 mg | ORAL_TABLET | Freq: Every day | ORAL | Status: DC | PRN

## 2011-10-26 NOTE — Progress Notes (Signed)
HPI:  78 year old gentleman presenting for followup evaluation. The patient is followed for nonobstructive coronary disease, hypertension, hyperlipidemia. He's had a difficult last year related to kidney stones and an inguinal hernia. He had had ureteral stents placed. He's had no recent chest pain or dyspnea. He denies palpitations, edema, orthopnea, or PND. He's lost weight over the last year but he thinks this is related to his medical problems and his weights been steady for the past few months. He complains of erectile dysfunction and would like to have medication to help.  Outpatient Encounter Prescriptions as of 10/26/2011  Medication Sig Dispense Refill  . aspirin 81 MG tablet Take 81 mg by mouth daily.        . cholecalciferol (VITAMIN D) 1000 UNITS tablet Take 1,000 Units by mouth daily.        . CRESTOR 20 MG tablet TAKE 1 TABLET BY MOUTH EVERY DAY  90 tablet  3  . esomeprazole (NEXIUM) 40 MG capsule Take 40 mg by mouth daily before breakfast.      . lisinopril (PRINIVIL,ZESTRIL) 10 MG tablet Take 10 mg by mouth daily.      . Multiple Vitamins-Minerals (CENTRUM SILVER PO) Take 1 tablet by mouth daily.      . nitroGLYCERIN (NITROSTAT) 0.4 MG SL tablet Place 1 tablet (0.4 mg total) under the tongue every 5 (five) minutes as needed. For chest pain  25 tablet  6    Allergies  Allergen Reactions  . Aciphex (Rabeprazole)   . Clarithromycin     Flushed face  . Protonix (Pantoprazole Sodium)   . Tadalafil     Flushed face    Past Medical History  Diagnosis Date  . ALLERGIC RHINITIS 05/12/2009  . BACK PAIN 01/15/2007  . BURSITIS, LEFT SHOULDER 11/01/2006  . CORONARY ARTERY DISEASE 11/01/2006  . Dizziness and giddiness 09/29/2008  . DYSLIPIDEMIA 11/01/2006  . 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 FATIGUE 06/04/2008  . GERD 01/14/2006  . GROIN STRAIN, RIGHT 12/24/2009  . HIP PAIN, CHRONIC 11/01/2006  . LESION, FACE 09/10/2009  . LUNG NODULE 01/25/2010  . MACULAR DEGENERATION,  BILATERAL 01/30/2009  . OSTEOPOROSIS 07/25/2007  . Pain in joint, shoulder region 03/04/2009  . PERIPHERAL VASCULAR DISEASE 07/25/2007  . Plantar wart 09/10/2009  . Preventative health care 07/25/2010  . RASH-NONVESICULAR 09/29/2008  . RECTAL PAIN 01/15/2007  . ROTATOR CUFF TEAR 11/01/2006  . SPONDYLOSIS, LUMBAR 11/01/2006  . SWELLING MASS OR LUMP IN HEAD AND NECK 01/29/2008  . Kidney stones     right  . Blood in urine   . H/O hiatal hernia   . HYPERTENSION 11/01/2006    dr Yomar Mejorado    ROS: Negative except as per HPI  BP 116/70  Pulse 78  Ht 5\' 7"  (1.702 m)  Wt 56.7 kg (125 lb)  BMI 19.58 kg/m2  PHYSICAL EXAM: Pt is alert and oriented, pleasant, thin elderly gentleman in NAD HEENT: normal Neck: JVP - normal, carotids 2+= without bruits Lungs: CTA bilaterally CV: RRR without murmur or gallop Abd: soft, NT, Positive BS, no hepatomegaly Ext: no C/C/E, distal pulses intact and equal Skin: warm/dry no rash  EKG:  Normal sinus rhythm 70 beats per minute, right bundle branch block.  ASSESSMENT AND PLAN: 1. Coronary artery disease. The patient remained stable without angina. He underwent multiple surgical procedures over the past year without any cardiac problems. He should continue on his current medical regimen.  2. Essential hypertension. Blood pressure is well controlled on lisinopril.  3. Hyperlipidemia. The patient is on Crestor. His lipids are followed by Dr. Jonny Ruiz.  4. Erectile dysfunction. A prescription for Viagra was written. He understands the contraindication with nitrates  Tonny Bollman 10/26/2011 3:25 PM

## 2011-10-26 NOTE — Patient Instructions (Addendum)
Your physician wants you to follow-up in: ONE YEAR WITH DR COOPER You will receive a reminder letter in the mail two months in advance. If you don't receive a letter, please call our office to schedule the follow-up appointment.  

## 2012-02-01 ENCOUNTER — Other Ambulatory Visit: Payer: Self-pay | Admitting: Internal Medicine

## 2012-02-15 ENCOUNTER — Ambulatory Visit: Payer: Medicare Other | Admitting: Internal Medicine

## 2012-02-20 ENCOUNTER — Other Ambulatory Visit: Payer: Self-pay | Admitting: Internal Medicine

## 2012-03-01 ENCOUNTER — Ambulatory Visit (INDEPENDENT_AMBULATORY_CARE_PROVIDER_SITE_OTHER): Payer: Medicare Other | Admitting: Internal Medicine

## 2012-03-01 ENCOUNTER — Other Ambulatory Visit (INDEPENDENT_AMBULATORY_CARE_PROVIDER_SITE_OTHER): Payer: Medicare Other

## 2012-03-01 ENCOUNTER — Encounter: Payer: Self-pay | Admitting: Internal Medicine

## 2012-03-01 VITALS — BP 132/82 | HR 66 | Temp 96.9°F | Ht 67.5 in | Wt 124.5 lb

## 2012-03-01 LAB — BASIC METABOLIC PANEL
BUN: 24 mg/dL — ABNORMAL HIGH (ref 6–23)
Calcium: 9.4 mg/dL (ref 8.4–10.5)
Chloride: 105 mEq/L (ref 96–112)
Creatinine, Ser: 1.2 mg/dL (ref 0.4–1.5)
GFR: 59.31 mL/min — ABNORMAL LOW (ref >60.00)

## 2012-03-01 LAB — LIPID PANEL
Cholesterol: 203 mg/dL — ABNORMAL HIGH (ref 0–200)
HDL: 62.5 mg/dL (ref >39.00)
Total CHOL/HDL Ratio: 3
Triglycerides: 180 mg/dL — ABNORMAL HIGH (ref 0.0–149.0)
VLDL: 36 mg/dL (ref 0.0–40.0)

## 2012-03-01 LAB — URINALYSIS, ROUTINE W REFLEX MICROSCOPIC
Bilirubin Urine: NEGATIVE
Nitrite: NEGATIVE
Total Protein, Urine: NEGATIVE
pH: 5.5 (ref 5.0–8.0)

## 2012-03-01 LAB — CBC WITH DIFFERENTIAL/PLATELET
Basophils Relative: 0.6 % (ref 0.0–3.0)
Eosinophils Relative: 3.3 % (ref 0.0–5.0)
Hemoglobin: 12.8 g/dL — ABNORMAL LOW (ref 13.0–17.0)
MCV: 91.6 fl (ref 78.0–100.0)
Monocytes Absolute: 0.5 10*3/uL (ref 0.1–1.0)
Neutro Abs: 3.8 10*3/uL (ref 1.4–7.7)
Neutrophils Relative %: 64.6 % (ref 43.0–77.0)
RBC: 4.15 Mil/uL — ABNORMAL LOW (ref 4.22–5.81)
WBC: 5.8 10*3/uL (ref 4.5–10.5)

## 2012-03-01 LAB — HEPATIC FUNCTION PANEL
ALT: 17 U/L (ref 0–53)
Total Bilirubin: 0.8 mg/dL (ref 0.3–1.2)

## 2012-03-01 LAB — LDL CHOLESTEROL, DIRECT: Direct LDL: 120.2 mg/dL

## 2012-03-01 MED ORDER — ROSUVASTATIN CALCIUM 20 MG PO TABS: 20.0000 mg | ORAL_TABLET | Freq: Every day | ORAL | Status: DC

## 2012-03-01 MED ORDER — SILDENAFIL CITRATE 100 MG PO TABS: 100.0000 mg | ORAL_TABLET | Freq: Every day | ORAL | Status: DC | PRN

## 2012-03-01 MED ORDER — TRAMADOL HCL 50 MG PO TABS: 100.0000 mg | ORAL_TABLET | Freq: Two times a day (BID) | ORAL | Status: DC | PRN

## 2012-03-01 NOTE — Assessment & Plan Note (Addendum)
stable overall by history and exam, recent data reviewed with pt, and pt to continue medical treatment as before,  to f/u any worsening symptoms or concerns Lab Results  Component Value Date   LDLCALC 61 01/25/2010   To re-start crestor

## 2012-03-01 NOTE — Assessment & Plan Note (Signed)
For viagra prn,  to f/u any worsening symptoms or concerns 

## 2012-03-01 NOTE — Progress Notes (Signed)
Subjective:    Patient ID: [name] [name], male    DOB: 1924/01/27, 79 y.o.   MRN: [id number]  HPI  Here to f/u; overall doing ok,  Pt denies chest pain, increased sob or doe, wheezing, orthopnea, PND, increased LE swelling, palpitations, dizziness or syncope.  Pt denies polydipsia, polyuria, or low sugar symptoms such as weakness or confusion improved with po intake.  Pt denies new neurological symptoms such as new headache, or facial or extremity weakness or numbness.   Pt states overall good compliance with meds, has been trying to follow lower cholesterol diet, with wt overall stable,  but little exercise however. Has had some confusion, and crestor stopped after [place] physician noted he apparently already had a [facility] approved statin on his med list (which he is not actually taking)  Pt continues to have recurring right LBP for several months, worse to turn over in bed to the left, overall worse at night, hard to sleep lying flat, tramadol and alleve not working. And no bowel or bladder change, fever, wt loss,  worsening LE pain/numbness/weakness, gait change or falls. Asks for higher dose tramadol to stay away from hydrocodone or similar med.  S/p complicated right renal stone removal per urology and subsequent right hernia repair approx 8 mo ago.  Asks for the coupon for 3 free viagra, which is ok with his cardiologist per pt.  .pvhc Current Outpatient Prescriptions on File Prior to Visit  Medication Sig Dispense Refill  . aspirin 81 MG tablet Take 81 mg by mouth daily.        . cholecalciferol (VITAMIN D) 1000 UNITS tablet Take 1,000 Units by mouth daily.        Marland Kitchen esomeprazole (NEXIUM) 40 MG capsule Take 40 mg by mouth daily before breakfast.      . lisinopril (PRINIVIL,ZESTRIL) 10 MG tablet TAKE 1 TABLET BY MOUTH EVERY DAY  90 tablet  0  . Multiple Vitamins-Minerals (CENTRUM SILVER PO) Take 1 tablet by mouth daily.      Marland Kitchen NEXIUM 40 MG capsule TAKE ONE CAPSULE BY MOUTH DAILY  90 capsule  2  .  nitroGLYCERIN (NITROSTAT) 0.4 MG SL tablet Place 1 tablet (0.4 mg total) under the tongue every 5 (five) minutes as needed. For chest pain  25 tablet  6   No current facility-administered medications on file prior to visit.    Review of Systems  Constitutional: Negative for unexpected weight change, or unusual diaphoresis  HENT: Negative for tinnitus.   Eyes: Negative for photophobia and visual disturbance.  Respiratory: Negative for choking and stridor.   Gastrointestinal: Negative for vomiting and blood in stool.  Genitourinary: Negative for hematuria and decreased urine volume.  Musculoskeletal: Negative for acute joint swelling Skin: Negative for color change and wound.  Neurological: Negative for tremors and numbness other than noted  Psychiatric/Behavioral: Negative for decreased concentration or  hyperactivity.       Objective:   Physical Exam BP 132/82  Pulse 66  Temp(Src) 96.9 F (36.1 C) (Oral)  Ht 5' 7.5" (1.715 m)  Wt 124 lb 8 oz (56.473 kg)  BMI 19.2 kg/m2  SpO2 98% VS noted,  Constitutional: Pt appears well-developed and well-nourished.  HENT: Head: NCAT.  Right Ear: External ear normal.  Left Ear: External ear normal.  Eyes: Conjunctivae and EOM are normal. Pupils are equal, round, and reactive to light.  Neck: Normal range of motion. Neck supple.  Cardiovascular: Normal rate and regular rhythm.   Pulmonary/Chest: Effort normal and  breath sounds normal.  Abd:  Soft, NT, non-distended, + BS Neurological: Pt is alert. Not confused , motor intact Spine nontender Skin: Skin is warm. No erythema.  Psychiatric: Pt behavior is normal. Thought content normal.     Assessment & Plan:

## 2012-03-01 NOTE — Assessment & Plan Note (Signed)
stable overall by history and exam, recent data reviewed with pt, and pt to continue medical treatment as before,  to f/u any worsening symptoms or concerns le BP Readings from Last 3 Encounters:  03/01/12 132/82  10/26/11 116/70  08/10/11 132/78

## 2012-03-01 NOTE — Assessment & Plan Note (Signed)
To increase the tramadol prn, to f/u any worsening symptoms or concerns

## 2012-03-01 NOTE — Patient Instructions (Addendum)
Please take all new medication as prescribed  You are given the free viagra coupon to use; let us know if you need further prescription Please re-start the crestor Please continue all other medications as before, and refills have been done if requested. Please go to the LAB in the Basement (turn left off the elevator) for the tests to be done today You will be contacted by phone if any changes need to be made immediately.  Otherwise, you will receive a letter about your results with an explanation, but please check with MyChart first. Thank you for enrolling in MyChart. Please follow the instructions below to securely access your online medical record. MyChart allows you to send messages to your doctor, view your test results, renew your prescriptions, schedule appointments, and more. To Log into My Chart online, please go by [employer] or [employer] to [email or web address], or download the MyChart App from the [employer] .  Your Username is: [email or web address] (password - [email or web address]) Please keep your appointments with your specialists as you have planned  - cardiology, and urology Please return in 6 months, or sooner if needed, with Lab testing done 3-5 days before

## 2012-03-23 ENCOUNTER — Telehealth: Payer: Self-pay | Admitting: *Deleted

## 2012-03-23 NOTE — Telephone Encounter (Signed)
Pt left vm requesting his 03/01/12 labs be faxed to [place] at [phone].  Attn: Dr. Carmie Kanner.

## 2012-03-23 NOTE — Telephone Encounter (Signed)
Labs faxed to Dr. Carmie Kanner at [facility].

## 2012-04-04 ENCOUNTER — Encounter: Payer: Self-pay | Admitting: Internal Medicine

## 2012-04-04 ENCOUNTER — Ambulatory Visit (INDEPENDENT_AMBULATORY_CARE_PROVIDER_SITE_OTHER): Payer: Medicare Other | Admitting: Internal Medicine

## 2012-04-04 VITALS — BP 130/72 | HR 70 | Temp 96.8°F | Resp 14 | Wt 122.8 lb

## 2012-04-04 DIAGNOSIS — I10 Essential (primary) hypertension: Secondary | ICD-10-CM

## 2012-04-04 DIAGNOSIS — H8110 Benign paroxysmal vertigo, unspecified ear: Secondary | ICD-10-CM

## 2012-04-04 DIAGNOSIS — I2510 Atherosclerotic heart disease of native coronary artery without angina pectoris: Secondary | ICD-10-CM

## 2012-04-04 DIAGNOSIS — E785 Hyperlipidemia, unspecified: Secondary | ICD-10-CM

## 2012-04-04 MED ORDER — MECLIZINE HCL 12.5 MG PO TABS: 12.5000 mg | ORAL_TABLET | Freq: Three times a day (TID) | ORAL | Status: DC | PRN

## 2012-04-04 NOTE — Patient Instructions (Addendum)
It was good to see you today. Your dizziness and nausea is caused from positional vertigo - see information below regarding this condition Will treat symptoms with meclizine one tablet 3 times a day x72 hours, then as needed - Your prescription(s) have been submitted to your pharmacy. Please take as directed and contact our office if you believe you are having problem(s) with the medication(s). Please call if symptoms unimproved in next 4-5 days, sooner if worse No other medication changes recommended Vertigo Vertigo means you feel like you or your surroundings are moving when they are not. Vertigo can be dangerous if it occurs when you are at work, driving, or performing difficult activities.  CAUSES  Vertigo occurs when there is a conflict of signals sent to your brain from the visual and sensory systems in your body. There are many different causes of vertigo, including:  Infections, especially in the inner ear.  A bad reaction to a drug or misuse of alcohol and medicines.  Withdrawal from drugs or alcohol.  Rapidly changing positions, such as lying down or rolling over in bed.  A migraine headache.  Decreased blood flow to the brain.  Increased pressure in the brain from a head injury, infection, tumor, or bleeding. SYMPTOMS  You may feel as though the world is spinning around or you are falling to the ground. Because your balance is upset, vertigo can cause nausea and vomiting. You may have involuntary eye movements (nystagmus). DIAGNOSIS  Vertigo is usually diagnosed by physical exam. If the cause of your vertigo is unknown, your caregiver may perform imaging tests, such as an MRI scan (magnetic resonance imaging). TREATMENT  Most cases of vertigo resolve on their own, without treatment. Depending on the cause, your caregiver may prescribe certain medicines. If your vertigo is related to body position issues, your caregiver may recommend movements or procedures to correct the  problem. In rare cases, if your vertigo is caused by certain inner ear problems, you may need surgery. HOME CARE INSTRUCTIONS   Follow your caregiver's instructions.  Avoid driving.  Avoid operating heavy machinery.  Avoid performing any tasks that would be dangerous to you or others during a vertigo episode.  Tell your caregiver if you notice that certain medicines seem to be causing your vertigo. Some of the medicines used to treat vertigo episodes can actually make them worse in some people. SEEK IMMEDIATE MEDICAL CARE IF:   Your medicines do not relieve your vertigo or are making it worse.  You develop problems with talking, walking, weakness, or using your arms, hands, or legs.  You develop severe headaches.  Your nausea or vomiting continues or gets worse.  You develop visual changes.  A family member notices behavioral changes.  Your condition gets worse. MAKE SURE YOU:  Understand these instructions.  Will watch your condition.  Will get help right away if you are not doing well or get worse. Document Released: 09/29/2004 Document Revised: 03/14/2011 Document Reviewed: 07/08/2010 [facility] Patient Information 2013 [place], [place].

## 2012-04-04 NOTE — Assessment & Plan Note (Signed)
Recently (02/2012) resumed statin due to CAD/PAD The current medical regimen is effective;  continue present plan and medications.

## 2012-04-04 NOTE — Assessment & Plan Note (Signed)
No evidence for anginal symptoms -  reports cath few years ago with mild-mod blockage and recommended for med mgmt of same The current medical regimen is effective;  continue present plan and medications.

## 2012-04-04 NOTE — Progress Notes (Signed)
Subjective:    Patient ID: [name] [name], male    DOB: 03-04-1924, 79 y.o.   MRN: [id number]  HPI See CC Complains of vertigo Onset 4 or 5 days ago Symptoms precipitated by position change in bed: getting into or out of supine position Dizziness sensation and room spinning associated with nausea, no vomiting  mild sensation of left ear fullness but no pain, fever,increase allergy symptoms or sinus pressure Denies headache, vision change or weakness No recent falls, no head trauma or MVA No recent medication changes -resume statin greater than one month ago Prior history of same but generally lasting under 48 hours  Past Medical History  Diagnosis Date  . ALLERGIC RHINITIS   . CORONARY ARTERY DISEASE   . DYSLIPIDEMIA   . ERECTILE DYSFUNCTION, ORGANIC   . GERD   . LUNG NODULE 01/25/2010  . MACULAR DEGENERATION, BILATERAL 01/30/2009  . OSTEOPOROSIS   . PERIPHERAL VASCULAR DISEASE   . Plantar wart 09/10/2009  . ROTATOR CUFF TEAR 11/01/2006  . SPONDYLOSIS, LUMBAR 11/01/2006  . Kidney stones     right  . Blood in urine   . H/O hiatal hernia   . HYPERTENSION     dr cooper    Review of Systems See history of present illness above     Objective:   Physical Exam BP 130/72  Pulse 70  Temp(Src) 96.8 F (36 C) (Oral)  Resp 14  Wt 122 lb 12 oz (55.679 kg)  BMI 18.93 kg/m2  SpO2 95% Wt Readings from Last 3 Encounters:  04/04/12 122 lb 12 oz (55.679 kg)  03/01/12 124 lb 8 oz (56.473 kg)  10/26/11 125 lb (56.7 kg)   Constitutional: he appears well-developed and well-nourished. No distress.  HENT: Head: Normocephalic and atraumatic. Ears: B TMs ok, no erythema or effusion; Nose: Nose normal. Mouth/Throat: Oropharynx is clear and moist. No oropharyngeal exudate.  Eyes: No nystagmus. Conjunctivae and EOM are normal. Pupils are equal, round, and reactive to light. No scleral icterus.  Neck: Normal range of motion. Neck supple. No JVD present. No thyromegaly present.   Cardiovascular: Normal rate, regular rhythm and normal heart sounds.  No murmur heard. No BLE edema. Pulmonary/Chest: Effort normal and breath sounds normal. No respiratory distress. he has no wheezes.  Abdominal: Soft. Bowel sounds are normal. he exhibits no distension. There is no tenderness. no masses Neurological: he is alert and oriented to person, place, and time. No cranial nerve deficit. Coordination, speech, balance and strength are normal.  Skin: Skin is warm and dry. No rash noted. No erythema.  Psychiatric: he has a normal mood and affect. behavior is normal. Judgment and thought content normal.   Lab Results  Component Value Date   WBC 5.8 03/01/2012   HGB 12.8* 03/01/2012   HCT 38.0* 03/01/2012   PLT 258.0 03/01/2012   GLUCOSE 105* 03/01/2012   CHOL 203* 03/01/2012   TRIG 180.0* 03/01/2012   HDL 62.50 03/01/2012   LDLDIRECT 120.2 03/01/2012   LDLCALC 61 01/25/2010   ALT 17 03/01/2012   AST 18 03/01/2012   NA 140 03/01/2012   K 4.3 03/01/2012   CL 105 03/01/2012   CREATININE 1.2 03/01/2012   BUN 24* 03/01/2012   CO2 28 03/01/2012   TSH 0.91 03/01/2012   HGBA1C 5.7 05/12/2009        Assessment & Plan:   BPPV - cardiac, ENT and neuro exam are normal, HD stable Hx classic for positional vertigo Education on condition provided Low-dose meclizine 3  times a day x72 hours, then when necessary - erx done

## 2012-04-04 NOTE — Assessment & Plan Note (Signed)
  BP Readings from Last 3 Encounters:  04/04/12 130/72  03/01/12 132/82  10/26/11 116/70   The current medical regimen is effective;  continue present plan and medications.

## 2012-05-02 ENCOUNTER — Other Ambulatory Visit: Payer: Self-pay | Admitting: Internal Medicine

## 2012-06-01 ENCOUNTER — Other Ambulatory Visit: Payer: Self-pay | Admitting: Internal Medicine

## 2012-08-29 ENCOUNTER — Encounter: Payer: Self-pay | Admitting: Internal Medicine

## 2012-08-29 ENCOUNTER — Ambulatory Visit (INDEPENDENT_AMBULATORY_CARE_PROVIDER_SITE_OTHER): Payer: Medicare Other | Admitting: Internal Medicine

## 2012-08-29 VITALS — BP 120/78 | HR 67 | Temp 98.2°F | Ht 67.0 in | Wt 121.2 lb

## 2012-08-29 DIAGNOSIS — M545 Low back pain, unspecified: Secondary | ICD-10-CM

## 2012-08-29 DIAGNOSIS — R002 Palpitations: Secondary | ICD-10-CM

## 2012-08-29 DIAGNOSIS — Z136 Encounter for screening for cardiovascular disorders: Secondary | ICD-10-CM

## 2012-08-29 DIAGNOSIS — G8929 Other chronic pain: Secondary | ICD-10-CM

## 2012-08-29 DIAGNOSIS — I10 Essential (primary) hypertension: Secondary | ICD-10-CM

## 2012-08-29 DIAGNOSIS — Z23 Encounter for immunization: Secondary | ICD-10-CM

## 2012-08-29 DIAGNOSIS — E785 Hyperlipidemia, unspecified: Secondary | ICD-10-CM

## 2012-08-29 MED ORDER — ROSUVASTATIN CALCIUM 20 MG PO TABS: 20.0000 mg | ORAL_TABLET | Freq: Every day | ORAL | Status: DC

## 2012-08-29 NOTE — Assessment & Plan Note (Addendum)
ECG reviewed as per emr, stable overall by history and exam, recent data reviewed with pt, and pt to continue medical treatment as before,  to f/u any worsening symptoms or concerns BP Readings from Last 3 Encounters:  08/29/12 120/78  04/04/12 130/72  03/01/12 132/82

## 2012-08-29 NOTE — Assessment & Plan Note (Signed)
Ok for [employer] prn, actually needing less pain control as of late, tramadol no help,  to f/u any worsening symptoms or concerns

## 2012-08-29 NOTE — Progress Notes (Signed)
Subjective:    Patient ID: [name] [name], male    DOB: 08/20/1924, 79 y.o.   MRN: [id number]  HPI  Here to f/u; overall doing ok,  Pt denies chest pain, increased sob or doe, wheezing, orthopnea, PND, increased LE swelling, palpitations, dizziness or syncope, though did have 2 episodes of discrete fluttering in the chest over a wk ago - no CP, dizziness, SOb or other assoc symptoms  Pt denies polydipsia, polyuria, or low sugar symptoms such as weakness or confusion improved with po intake.  Pt denies new neurological symptoms such as new headache, or facial or extremity weakness or numbness.   Pt states overall good compliance with meds, has been trying to follow lower cholesterol diet.  Has lost further wt but now seems stable for the past mo, remaiins fairly active for age, appetite just not what it used to be.  S/p cortisone shots to both shoulders, and lower back with Dr Farris Has.  No other acute complaints. States has f/u with card oct 2014. Pt continues to have recurring LBP without change in severity, bowel or bladder change, fever, wt loss,  worsening LE pain/numbness/weakness, gait change or falls. Past Medical History  Diagnosis Date  . ALLERGIC RHINITIS   . CORONARY ARTERY DISEASE   . DYSLIPIDEMIA   . ERECTILE DYSFUNCTION, ORGANIC   . GERD   . LUNG NODULE 01/25/2010  . MACULAR DEGENERATION, BILATERAL 01/30/2009  . OSTEOPOROSIS   . PERIPHERAL VASCULAR DISEASE   . Plantar wart 09/10/2009  . ROTATOR CUFF TEAR 11/01/2006  . SPONDYLOSIS, LUMBAR 11/01/2006  . Kidney stones     right  . Blood in urine   . H/O hiatal hernia   . HYPERTENSION     dr Excell Seltzer   Past Surgical History  Procedure Laterality Date  . Cataract extraction, bilateral    . Tonsillectomy    . Inguinal herniorrhapy bilateral    . Ganglion cyst excision  1960  . Cystoscopy with ureteroscopy  11/23/2010    Procedure: CYSTOSCOPY WITH URETEROSCOPY;  Surgeon: Antony Haste, MD;  Location: [facility];  Service: Urology;  Laterality: Right;  Prostate exam under anesthesia   . Stone extraction with basket  11/23/2010    Procedure: STONE EXTRACTION WITH BASKET;  Surgeon: Antony Haste, MD;  Location: [facility];  Service: Urology;  Laterality: Right;  . Hernia repair    . Cardiac catheterization      06     [place].  . Eye surgery    . Inguinal hernia repair  04/15/2011    Procedure: HERNIA REPAIR INGUINAL ADULT;  Surgeon: Shelly Rubenstein, MD;  Location: [facility];  Service: General;  Laterality: Right;  right inguinal hernia repair with mesh    reports that he has never smoked. He has never used smokeless tobacco. He reports that he drinks about 0.6 ounces of alcohol per week. He reports that he does not use illicit drugs. family history is not on file. Allergies  Allergen Reactions  . Aciphex [Rabeprazole]   . Clarithromycin     Flushed face  . Protonix [Pantoprazole Sodium]   . Tadalafil     Flushed face   Current Outpatient Prescriptions on File Prior to Visit  Medication Sig Dispense Refill  . aspirin 81 MG tablet Take 81 mg by mouth daily.        . cholecalciferol (VITAMIN D) 1000 UNITS tablet Take 1,000 Units by mouth daily.        Marland Kitchen  lisinopril (PRINIVIL,ZESTRIL) 10 MG tablet TAKE 1 TABLET BY MOUTH EVERY DAY  90 tablet  3  . meclizine (ANTIVERT) 12.5 MG tablet Take 1 tablet (12.5 mg total) by mouth 3 (three) times daily as needed for dizziness or nausea.  30 tablet  0  . Multiple Vitamins-Minerals (CENTRUM SILVER PO) Take 1 tablet by mouth daily.      Marland Kitchen NEXIUM 40 MG capsule TAKE ONE CAPSULE BY MOUTH DAILY  90 capsule  2  . nitroGLYCERIN (NITROSTAT) 0.4 MG SL tablet Place 1 tablet (0.4 mg total) under the tongue every 5 (five) minutes as needed. For chest pain  25 tablet  6  . traMADol (ULTRAM) 50 MG tablet Take 1 tablet (50 mg total) by mouth every 6 (six) hours as needed for pain.  120 tablet  5  . traMADol (ULTRAM) 50 MG tablet Take 2 tablets (100  mg total) by mouth 2 (two) times daily as needed for pain.  60 tablet  5  . traMADol-acetaminophen (ULTRACET) 37.5-325 MG per tablet TAKE 1 TABLET BY MOUTH TWICE DAILY AS NEEDED  60 tablet  0   No current facility-administered medications on file prior to visit.     Review of Systems  Constitutional: Negative for unexpected weight change, or unusual diaphoresis  HENT: Negative for tinnitus.   Eyes: Negative for photophobia and visual disturbance.  Respiratory: Negative for choking and stridor.   Gastrointestinal: Negative for vomiting and blood in stool.  Genitourinary: Negative for hematuria and decreased urine volume.  Musculoskeletal: Negative for acute joint swelling Skin: Negative for color change and wound.  Neurological: Negative for tremors and numbness other than noted  Psychiatric/Behavioral: Negative for decreased concentration or  hyperactivity.       Objective:   Physical Exam BP 120/78  Pulse 67  Temp(Src) 98.2 F (36.8 C) (Oral)  Ht 5\' 7"  (1.702 m)  Wt 121 lb 4 oz (54.999 kg)  BMI 18.99 kg/m2  SpO2 98% VS noted,  Constitutional: Pt appears well-developed and well-nourished.  HENT: Head: NCAT.  Right Ear: External ear normal.  Left Ear: External ear normal.  Eyes: Conjunctivae and EOM are normal. Pupils are equal, round, and reactive to light.  Neck: Normal range of motion. Neck supple.  Cardiovascular: Normal rate and regular rhythm.   Pulmonary/Chest: Effort normal and breath sounds normal.  Abd:  Soft, NT, non-distended, + BS Neurological: Pt is alert. Not confused  Skin: Skin is warm. No erythema.  Psychiatric: Pt behavior is normal. Thought content normal.         Assessment & Plan:

## 2012-08-29 NOTE — Patient Instructions (Signed)
You had the flu shot today We made the copy of the form you dropped off today Please keep your appointments with your specialists as you have planned - cardiology, and the Moh's surgury for your face Please re-start the Crestor Please continue all other medications as before, and refills have been done if requested. Please have the pharmacy call with any other refills you may need. No further lab work needed today  Please remember to sign up for My Chart if you have not done so, as this will be important to you in the future with finding out test results, communicating by private email, and scheduling acute appointments online when needed.  Please return in 6 months, or sooner if needed

## 2012-08-29 NOTE — Assessment & Plan Note (Signed)
Etiology unclear, ECG reviewed as per emr, no assoc symptoms, cont to monitor symptoms for now

## 2012-08-29 NOTE — Assessment & Plan Note (Addendum)
stable overall by history and exam, recent data reviewed with pt, and pt to continue medical treatment as before,  to f/u any worsening symptoms or concerns Lab Results  Component Value Date   LDLCALC 61 01/25/2010   Pt has been off crestor recently,plans to re-start

## 2012-10-04 ENCOUNTER — Other Ambulatory Visit: Payer: Self-pay | Admitting: *Deleted

## 2012-10-04 MED ORDER — MECLIZINE HCL 12.5 MG PO TABS: 12.5000 mg | ORAL_TABLET | Freq: Three times a day (TID) | ORAL | Status: DC | PRN

## 2012-10-17 ENCOUNTER — Ambulatory Visit (INDEPENDENT_AMBULATORY_CARE_PROVIDER_SITE_OTHER): Payer: Medicare Other | Admitting: Internal Medicine

## 2012-10-17 ENCOUNTER — Encounter: Payer: Self-pay | Admitting: Internal Medicine

## 2012-10-17 VITALS — BP 138/80 | HR 80 | Temp 97.3°F | Ht 67.5 in | Wt 121.0 lb

## 2012-10-17 DIAGNOSIS — I10 Essential (primary) hypertension: Secondary | ICD-10-CM

## 2012-10-17 DIAGNOSIS — R42 Dizziness and giddiness: Secondary | ICD-10-CM

## 2012-10-17 DIAGNOSIS — R55 Syncope and collapse: Secondary | ICD-10-CM | POA: Insufficient documentation

## 2012-10-17 MED ORDER — MECLIZINE HCL 12.5 MG PO TABS: 12.5000 mg | ORAL_TABLET | Freq: Three times a day (TID) | ORAL | Status: DC | PRN

## 2012-10-17 NOTE — Progress Notes (Signed)
Subjective:    Patient ID: [name] [name], male    DOB: Feb 08, 1924, 79 y.o.   MRN: [id number]  HPI  Here to f/u, c/o an episode of syncope sept 22 in [place] (? Heat related) where he collapsed from a standing position, helped to ground by wife but may have hit head on concrete, laid for approx 15 min, not clear how long he was out - likely several minutes, no prodrome it seems, no overt siezure activity, able to be helped into the house to lie down, refused medical attention at that time, seemed better after a few hrs, no further symtoms and Pt denies chest pain, increased sob or doe, wheezing, orthopnea, PND, increased LE swelling, palpitations, or syncope since that time, but has felt some dizziness several times, not clear if better with meclizine  No obvious head trauma such as contusion. Seen at [facility] oct 10, had 2 facial skin Ca removed, d/w derm and refused head CT at that time though this was offered; did have blood labs done and provides [place] with a number to call to try to get results - [phone] but could not hold as she was #[phone] in the queue.  Incidentally also had optho exam this am, similar hx as above noted, no worsening eye findings, asked to monitor BP control and encouraged to f/u here. Also mentions a fullness/firmness area nontender to what seems to me the right carotid bifurcation area.  Declines repeat testing now for carotids as he has regular f/u, but would like his oct 29 appt with Dr Cooper/card moved up if possible.  Pt denies new neurological symptoms such as new headache, or facial or extremity weakness or numbness   Pt denies polydipsia, polyuria.  Pt denies fever, wt loss, night sweats, loss of appetite, or other constitutional symptoms Past Medical History  Diagnosis Date  . ALLERGIC RHINITIS   . CORONARY ARTERY DISEASE   . DYSLIPIDEMIA   . ERECTILE DYSFUNCTION, ORGANIC   . GERD   . LUNG NODULE 01/25/2010  . MACULAR DEGENERATION, BILATERAL 01/30/2009  .  OSTEOPOROSIS   . PERIPHERAL VASCULAR DISEASE   . Plantar wart 09/10/2009  . ROTATOR CUFF TEAR 11/01/2006  . SPONDYLOSIS, LUMBAR 11/01/2006  . Kidney stones     right  . Blood in urine   . H/O hiatal hernia   . HYPERTENSION     dr Excell Seltzer   Past Surgical History  Procedure Laterality Date  . Cataract extraction, bilateral    . Tonsillectomy    . Inguinal herniorrhapy bilateral    . Ganglion cyst excision  1960  . Cystoscopy with ureteroscopy  11/23/2010    Procedure: CYSTOSCOPY WITH URETEROSCOPY;  Surgeon: Antony Haste, MD;  Location: [facility];  Service: Urology;  Laterality: Right;  Prostate exam under anesthesia   . Stone extraction with basket  11/23/2010    Procedure: STONE EXTRACTION WITH BASKET;  Surgeon: Antony Haste, MD;  Location: [facility];  Service: Urology;  Laterality: Right;  . Hernia repair    . Cardiac catheterization      06     [place].  . Eye surgery    . Inguinal hernia repair  04/15/2011    Procedure: HERNIA REPAIR INGUINAL ADULT;  Surgeon: Shelly Rubenstein, MD;  Location: [facility];  Service: General;  Laterality: Right;  right inguinal hernia repair with mesh    reports that he has never smoked. He has never used smokeless tobacco. He  reports that he drinks about 0.6 ounces of alcohol per week. He reports that he does not use illicit drugs. family history is not on file. Allergies  Allergen Reactions  . Aciphex [Rabeprazole]   . Clarithromycin     Flushed face  . Protonix [Pantoprazole Sodium]   . Tadalafil     Flushed face   Current Outpatient Prescriptions on File Prior to Visit  Medication Sig Dispense Refill  . aspirin 81 MG tablet Take 81 mg by mouth daily.        . cholecalciferol (VITAMIN D) 1000 UNITS tablet Take 1,000 Units by mouth daily.        Marland Kitchen lisinopril (PRINIVIL,ZESTRIL) 10 MG tablet TAKE 1 TABLET BY MOUTH EVERY DAY  90 tablet  3  . Multiple Vitamins-Minerals (CENTRUM SILVER PO) Take 1  tablet by mouth daily.      Marland Kitchen NEXIUM 40 MG capsule TAKE ONE CAPSULE BY MOUTH DAILY  90 capsule  2  . nitroGLYCERIN (NITROSTAT) 0.4 MG SL tablet Place 1 tablet (0.4 mg total) under the tongue every 5 (five) minutes as needed. For chest pain  25 tablet  6  . rosuvastatin (CRESTOR) 20 MG tablet Take 1 tablet (20 mg total) by mouth daily.  90 tablet  3  . traMADol (ULTRAM) 50 MG tablet Take 2 tablets (100 mg total) by mouth 2 (two) times daily as needed for pain.  60 tablet  5  . traMADol-acetaminophen (ULTRACET) 37.5-325 MG per tablet TAKE 1 TABLET BY MOUTH TWICE DAILY AS NEEDED  60 tablet  0  . traMADol (ULTRAM) 50 MG tablet Take 1 tablet (50 mg total) by mouth every 6 (six) hours as needed for pain.  120 tablet  5   No current facility-administered medications on file prior to visit.   Review of Systems  Constitutional: Negative for unexpected weight change, or unusual diaphoresis  HENT: Negative for tinnitus.   Eyes: Negative for photophobia and visual disturbance.  Respiratory: Negative for choking and stridor.   Gastrointestinal: Negative for vomiting and blood in stool.  Genitourinary: Negative for hematuria and decreased urine volume.  Musculoskeletal: Negative for acute joint swelling Skin: Negative for color change and wound.  Neurological: Negative for tremors and numbness other than noted  Psychiatric/Behavioral: Negative for decreased concentration or  hyperactivity.       Objective:   Physical Exam BP 138/80  Pulse 80  Temp(Src) 97.3 F (36.3 C) (Oral)  Ht 5' 7.5" (1.715 m)  Wt 121 lb (54.885 kg)  BMI 18.66 kg/m2  SpO2 98% VS noted, not ill appaering Constitutional: Pt appears well-developed and well-nourished.  HENT: Head: NCAT.  Right Ear: External ear normal.  Left Ear: External ear normal.  Eyes: Conjunctivae and EOM are normal. Pupils are equal, round, and reactive to light.  Neck: Normal range of motion. Neck supple. Has approx 1 cm "mass" to area right carotid  bifurcation that seems pulsatile, nontender Cardiovascular: Normal rate and regular rhythm.   Pulmonary/Chest: Effort normal and breath sounds normal.  Abd:  Soft, NT, non-distended, + BS Neurological: Pt is alert. Not confused , motor 5/5, gait intact Skin: Skin is warm. No erythema. No LE edema Psychiatric: Pt behavior is normal. Thought content normal. not depressed affect or overly nervous    Assessment & Plan:

## 2012-10-17 NOTE — Patient Instructions (Signed)
You will be contacted regarding the referral for: CT scan of the head - to see [facility] now  Please continue all other medications as before, and refills have been done if requested.  Please keep your appointments with your specialists as you have planned - Dr Excell Seltzer (and we will call to try to get the appt moved up sooner if possible)  We will call for your most recent labs at the [facility]

## 2012-10-17 NOTE — Assessment & Plan Note (Addendum)
ECG reviewed as per emr, for head CT no CM today as above r/o SDH or other, cont same medications for now, will ask our [facility] to see if appt with cardiology might be moved up, declines specific f/u/eval for carotids such as dopplers  Note:  Total time for pt hx, exam, review of record with pt in the room, determination of diagnoses and plan for further eval and tx is > 40 min, with over 50% spent in coordination and counseling of patient

## 2012-10-18 ENCOUNTER — Ambulatory Visit (INDEPENDENT_AMBULATORY_CARE_PROVIDER_SITE_OTHER)
Admission: RE | Admit: 2012-10-18 | Discharge: 2012-10-18 | Disposition: A | Discharge status: Home / Self Care | Payer: Medicare Other | Source: Ambulatory Visit | Attending: Internal Medicine | Admitting: Internal Medicine

## 2012-10-18 ENCOUNTER — Other Ambulatory Visit: Payer: Self-pay

## 2012-10-18 ENCOUNTER — Telehealth: Payer: Self-pay | Admitting: Cardiovascular Disease

## 2012-10-18 DIAGNOSIS — R55 Syncope and collapse: Secondary | ICD-10-CM

## 2012-10-18 MED ORDER — TRAMADOL HCL 50 MG PO TABS: 100.0000 mg | ORAL_TABLET | Freq: Two times a day (BID) | ORAL | Status: DC | PRN

## 2012-10-18 NOTE — Telephone Encounter (Signed)
Faxed hardcopy to [employer]. [address] [place]

## 2012-10-18 NOTE — Telephone Encounter (Signed)
Done hardcopy to robin  

## 2012-10-18 NOTE — Assessment & Plan Note (Signed)
stable overall by history and exam, recent data reviewed with pt, and pt to continue medical treatment as before,  to f/u any worsening symptoms or concerns BP Readings from Last 3 Encounters:  10/17/12 138/80  08/29/12 120/78  04/04/12 130/72

## 2012-10-18 NOTE — Telephone Encounter (Signed)
New problem   Pt need sooner appt b/c of syncope/dizziness and guide ness. Please call Gavin Pound at primary care

## 2012-10-18 NOTE — Assessment & Plan Note (Signed)
Etiology unclear, not sure if the meclizine helped and now out, not orthostatic today, does not want to try further, declines further lab work here, will cont to try to get labs from [place] if possible, for Head CT no cm today

## 2012-10-18 NOTE — Telephone Encounter (Addendum)
I spoke with Gavin Pound and made her aware that I currently do not have any earlier availability with Dr Excell Seltzer.  In reviewing the pt's appointments it looks like his Dr Excell Seltzer appointment has been cancelled on 10/31/12 and moved up to 10/29/12 with Tereso Newcomer PA-C (this is the first available slot that I could find with an extender). If the pt needs to be evaluated by Cardiology earlier then Dr Jonny Ruiz can speak with our DOD.  I make Gavin Pound aware that she needs to speak with the pt and determine if they are okay with seeing a PA versus Dr Excell Seltzer.

## 2012-10-22 ENCOUNTER — Telehealth: Payer: Self-pay | Admitting: Cardiovascular Disease

## 2012-10-22 NOTE — Telephone Encounter (Signed)
New Problem:  Pt states he would like the results of his recent CT scan. Please advise

## 2012-10-22 NOTE — Telephone Encounter (Signed)
I spoke with the pt and made him aware of CT results.  I also read the pt the comments that were sent to him by Dr Jonny Ruiz through My Chart.

## 2012-10-24 ENCOUNTER — Ambulatory Visit (INDEPENDENT_AMBULATORY_CARE_PROVIDER_SITE_OTHER): Payer: Medicare Other | Admitting: Cardiovascular Disease

## 2012-10-24 ENCOUNTER — Encounter: Payer: Self-pay | Admitting: Cardiovascular Disease

## 2012-10-24 VITALS — BP 137/74 | HR 64 | Ht 67.5 in | Wt 120.0 lb

## 2012-10-24 DIAGNOSIS — R634 Abnormal weight loss: Secondary | ICD-10-CM

## 2012-10-24 DIAGNOSIS — R42 Dizziness and giddiness: Secondary | ICD-10-CM

## 2012-10-24 DIAGNOSIS — R109 Unspecified abdominal pain: Secondary | ICD-10-CM

## 2012-10-24 NOTE — Patient Instructions (Signed)
Your physician has recommended you make the following change in your medication: STOP Lisinopril  You have been referred to Gastroenterologist for evaluation of weight loss and abdominal pain.   Your physician wants you to follow-up in: 1 YEAR with Dr Excell Seltzer.  You will receive a reminder letter in the mail two months in advance. If you don't receive a letter, please call our office to schedule the follow-up appointment.

## 2012-10-24 NOTE — Progress Notes (Signed)
HPI:  79 year old gentleman presenting for followup evaluation. He has a history of nonobstructive CAD, hypertension, and hyperlipidemia. The patient denies chest pain or shortness of breath. He had an episode of syncope last month when he was working out in the heat. He describes episodic abdominal pains. He's noted weight loss over the past few years. His appetite is marginal but he does get adequate calories per his report. He denies cough, fever, chills, or night sweats. He's had no melena, hematochezia, or recent change in bowel habits. He has some lightheadedness with positional changes.  Outpatient Encounter Prescriptions as of 10/24/2012  Medication Sig Dispense Refill  . aspirin 81 MG tablet Take 81 mg by mouth daily.        . cholecalciferol (VITAMIN D) 1000 UNITS tablet Take 1,000 Units by mouth daily.        Marland Kitchen lisinopril (PRINIVIL,ZESTRIL) 10 MG tablet TAKE 1 TABLET BY MOUTH EVERY DAY  90 tablet  3  . meclizine (ANTIVERT) 12.5 MG tablet Take 1 tablet (12.5 mg total) by mouth 3 (three) times daily as needed for dizziness or nausea.  30 tablet  6  . Multiple Vitamins-Minerals (CENTRUM SILVER PO) Take 1 tablet by mouth daily.      Marland Kitchen NEXIUM 40 MG capsule TAKE ONE CAPSULE BY MOUTH DAILY  90 capsule  2  . nitroGLYCERIN (NITROSTAT) 0.4 MG SL tablet Place 1 tablet (0.4 mg total) under the tongue every 5 (five) minutes as needed. For chest pain  25 tablet  6  . rosuvastatin (CRESTOR) 20 MG tablet Take 1 tablet (20 mg total) by mouth daily.  90 tablet  3  . traMADol (ULTRAM) 50 MG tablet Take 2 tablets (100 mg total) by mouth 2 (two) times daily as needed for pain.  60 tablet  5  . [DISCONTINUED] traMADol (ULTRAM) 50 MG tablet Take 1 tablet (50 mg total) by mouth every 6 (six) hours as needed for pain.  120 tablet  5   No facility-administered encounter medications on file as of 10/24/2012.    Allergies  Allergen Reactions  . Aciphex [Rabeprazole]   . Clarithromycin     Flushed face  .  Protonix [Pantoprazole Sodium]   . Tadalafil     Flushed face    Past Medical History  Diagnosis Date  . ALLERGIC RHINITIS   . CORONARY ARTERY DISEASE   . DYSLIPIDEMIA   . ERECTILE DYSFUNCTION, ORGANIC   . GERD   . LUNG NODULE 01/25/2010  . MACULAR DEGENERATION, BILATERAL 01/30/2009  . OSTEOPOROSIS   . PERIPHERAL VASCULAR DISEASE   . Plantar wart 09/10/2009  . ROTATOR CUFF TEAR 11/01/2006  . SPONDYLOSIS, LUMBAR 11/01/2006  . Kidney stones     right  . Blood in urine   . H/O hiatal hernia   . HYPERTENSION     dr Lambros Cerro   ROS: Negative except as per HPI  BP 137/74  Pulse 64  Ht 5' 7.5" (1.715 m)  Wt 120 lb (54.432 kg)  BMI 18.51 kg/m2  PHYSICAL EXAM: Pt is alert and oriented, pleasant, thin elderly gentleman in NAD HEENT: normal Neck: JVP - normal, carotids 2+= without bruits Lungs: CTA bilaterally CV: RRR without murmur or gallop Abd: soft, Positive BS, no hepatomegaly, mild lower abdominal tenderness to palpation without appreciation of mass. There is no rebound or guarding. Ext: no C/C/E, distal pulses intact and equal Skin: warm/dry no rash  ASSESSMENT AND PLAN: 1. Syncope. Isolated episode that occurred when working outside and  hot temperatures. Suspect related to a hypotensive event. The patient has continued to lose weight and I suspect his blood pressure is lower than baseline. Orthostatic vital signs were checked today and he will was not significantly orthostatic. Despite that, he did become dizzy with sitting up after lying supine. I recommended that we stop lisinopril. He was advised to push fluids.  2. Weight loss. With abdominal discomfort recommended GI evaluation. Referral was made.  3. Hypertension. Lisinopril stopped as above.  4. Nonobstructive coronary artery disease. The patient takes aspirin 81 mg. He is on a statin drug for risk reduction. He is not having anginal symptoms.

## 2012-10-25 ENCOUNTER — Encounter: Payer: Self-pay | Admitting: Internal Medicine

## 2012-10-26 ENCOUNTER — Encounter: Payer: Self-pay | Admitting: Cardiovascular Disease

## 2012-10-26 ENCOUNTER — Encounter: Payer: Self-pay | Admitting: Internal Medicine

## 2012-10-29 ENCOUNTER — Ambulatory Visit: Payer: Medicare Other | Admitting: Physician Assistant

## 2012-10-30 ENCOUNTER — Encounter: Payer: Self-pay | Admitting: Internal Medicine

## 2012-10-30 ENCOUNTER — Other Ambulatory Visit (INDEPENDENT_AMBULATORY_CARE_PROVIDER_SITE_OTHER): Payer: Medicare Other

## 2012-10-30 ENCOUNTER — Ambulatory Visit (INDEPENDENT_AMBULATORY_CARE_PROVIDER_SITE_OTHER): Payer: Medicare Other | Admitting: Internal Medicine

## 2012-10-30 VITALS — BP 132/70 | HR 70 | Ht 65.5 in | Wt 120.0 lb

## 2012-10-30 DIAGNOSIS — R10819 Abdominal tenderness, unspecified site: Secondary | ICD-10-CM

## 2012-10-30 DIAGNOSIS — R634 Abnormal weight loss: Secondary | ICD-10-CM

## 2012-10-30 LAB — CBC
HCT: 38.1 % — ABNORMAL LOW (ref 39.0–52.0)
MCHC: 34 g/dL (ref 30.0–36.0)
MCV: 91.8 fl (ref 78.0–100.0)
RDW: 12.9 % (ref 11.5–14.6)
WBC: 6.4 10*3/uL (ref 4.5–10.5)

## 2012-10-30 LAB — COMPREHENSIVE METABOLIC PANEL
AST: 21 U/L (ref 0–37)
Alkaline Phosphatase: 52 U/L (ref 39–117)
BUN: 22 mg/dL (ref 6–23)
Creatinine, Ser: 1.2 mg/dL (ref 0.4–1.5)
Total Bilirubin: 0.6 mg/dL (ref 0.3–1.2)

## 2012-10-30 NOTE — Patient Instructions (Signed)
Your physician has requested that you go to the basement for the following lab work before leaving today: CBC, CMP, TSH  You have been scheduled for a CT scan of the abdomen and pelvis at [facility] ([address]---this is in the same building as Architectural technologist).   You are scheduled on 12/01/2012 at 10:00. You should arrive 15 minutes prior to your appointment time for registration. Please follow the written instructions below on the day of your exam:  WARNING: IF YOU ARE ALLERGIC TO IODINE/X-RAY DYE, PLEASE NOTIFY RADIOLOGY IMMEDIATELY AT [phone]! YOU WILL BE GIVEN A 13 HOUR PREMEDICATION PREP.  1) Do not eat or drink anything after 6:00 (4 hours prior to your test) 2) You have been given 2 bottles of oral contrast to drink. The solution may taste   better if refrigerated, but do NOT add ice or any other liquid to this solution. Shake  well before drinking.    Drink 1 bottle of contrast @ 8:00 (2 hours prior to your exam)  Drink 1 bottle of contrast @ 9:00 (1 hour prior to your exam)  You may take any medications as prescribed with a small amount of water except for the following: Metformin, Glucophage, Glucovance, Avandamet, Riomet, Fortamet, Actoplus Met, Janumet, Glumetza or Metaglip. The above medications must be held the day of the exam AND 48 hours after the exam.  The purpose of you drinking the oral contrast is to aid in the visualization of your intestinal tract. The contrast solution may cause some diarrhea. Before your exam is started, you will be given a small amount of fluid to drink. Depending on your individual set of symptoms, you may also receive an intravenous injection of x-ray contrast/dye. Plan on being at [facility] for 30 minutes or long, depending on the type of exam you are having performed.  If you have any questions regarding your exam or if you need to reschedule, you may call the CT department at [phone] between the hours of 8:00  am and 5:00 pm, Monday-Friday.  ________________________________________________________________________

## 2012-10-30 NOTE — Progress Notes (Signed)
Patient ID: [name] [name], male   DOB: 1924/03/24, 79 y.o.   MRN: [id number] HPI: Mr. Banh is an 79 yo male with PMH of CAD, dyslipidemia, osteoporosis, peripheral vascular disease, lumbar spondylosis, kidney stones, hypertension and hiatal hernia who is seen in consultation at the request of Dr. Excell Seltzer for evaluation of weight loss and abdominal pain. He is here alone today. The patient reports that over the last 6 months to a year he has lost approximately 15 pounds. He is unsure exactly why this is, but he does report a decreased appetite. He has had some epigastric abdominal pain which as been associated with dizziness. He was recently evaluated after a syncopal episode which occurred in [place]. He has CT scan of his head which was reportedly okay but he has continued to have dizzy spells. He reports dizziness and the room spinning after lying down at night. This is the time that he develops epigastric abdominal pain. He reports that the abdominal pain portion is not always present but the dizziness was more consistent with lying down. He did see Dr. Excell Seltzer and was taken off lisinopril, and over the last 2-3 days he's had no abdominal pain or dizziness.  He denies heartburn. He denies nausea. He reports normal bowel movements having one brown stool daily. He denies blood in his stool or melena. He reports no early satiety.  He recalls several previous colonoscopies, the last of which he feels may been 5 or 6 years ago. He reports no prior history of colon polyps.  Past Medical History  Diagnosis Date  . ALLERGIC RHINITIS   . CORONARY ARTERY DISEASE   . DYSLIPIDEMIA   . ERECTILE DYSFUNCTION, ORGANIC   . GERD   . LUNG NODULE 01/25/2010  . MACULAR DEGENERATION, BILATERAL 01/30/2009  . OSTEOPOROSIS   . PERIPHERAL VASCULAR DISEASE   . Plantar wart 09/10/2009  . ROTATOR CUFF TEAR 11/01/2006  . SPONDYLOSIS, LUMBAR 11/01/2006  . Kidney stones     right  . Blood in urine   . H/O hiatal hernia   .  HYPERTENSION     dr Excell Seltzer    Past Surgical History  Procedure Laterality Date  . Cataract extraction, bilateral    . Tonsillectomy    . Inguinal herniorrhapy bilateral    . Ganglion cyst excision  1960  . Cystoscopy with ureteroscopy  11/23/2010    Procedure: CYSTOSCOPY WITH URETEROSCOPY;  Surgeon: Antony Haste, MD;  Location: [facility];  Service: Urology;  Laterality: Right;  Prostate exam under anesthesia   . Stone extraction with basket  11/23/2010    Procedure: STONE EXTRACTION WITH BASKET;  Surgeon: Antony Haste, MD;  Location: [facility];  Service: Urology;  Laterality: Right;  . Hernia repair    . Cardiac catheterization      06     [place].  . Eye surgery    . Inguinal hernia repair  04/15/2011    Procedure: HERNIA REPAIR INGUINAL ADULT;  Surgeon: Shelly Rubenstein, MD;  Location: [facility] OR;  Service: General;  Laterality: Right;  right inguinal hernia repair with mesh    Current Outpatient Prescriptions  Medication Sig Dispense Refill  . aspirin 81 MG tablet Take 81 mg by mouth daily.        . cholecalciferol (VITAMIN D) 1000 UNITS tablet Take 1,000 Units by mouth daily.        . meclizine (ANTIVERT) 12.5 MG tablet Take 1 tablet (12.5 mg total) by mouth  3 (three) times daily as needed for dizziness or nausea.  30 tablet  6  . Multiple Vitamins-Minerals (CENTRUM SILVER PO) Take 1 tablet by mouth daily.      Marland Kitchen NEXIUM 40 MG capsule TAKE ONE CAPSULE BY MOUTH DAILY  90 capsule  2  . nitroGLYCERIN (NITROSTAT) 0.4 MG SL tablet Place 1 tablet (0.4 mg total) under the tongue every 5 (five) minutes as needed. For chest pain  25 tablet  6  . rosuvastatin (CRESTOR) 20 MG tablet Take 1 tablet (20 mg total) by mouth daily.  90 tablet  3  . traMADol (ULTRAM) 50 MG tablet Take 2 tablets (100 mg total) by mouth 2 (two) times daily as needed for pain.  60 tablet  5   No current facility-administered medications for this visit.    Allergies   Allergen Reactions  . Aciphex [Rabeprazole]   . Clarithromycin     Flushed face  . Protonix [Pantoprazole Sodium]   . Tadalafil     Flushed face    History reviewed. No pertinent family history.  History  Substance Use Topics  . Smoking status: Never Smoker   . Smokeless tobacco: Never Used  . Alcohol Use: 0.6 oz/week    1 Cans of beer per week     Comment: occasional    ROS: As per history of present illness, otherwise negative  BP 132/70  Pulse 70  Ht 5' 5.5" (1.664 m)  Wt 120 lb (54.432 kg)  BMI 19.66 kg/m2 Constitutional: Well-developed elderly-appearing male in no acute distress HEENT: Normocephalic and atraumatic. Oropharynx is clear and moist. No oropharyngeal exudate. Conjunctivae are normal.  No scleral icterus. Cardiovascular: Normal rate, regular rhythm and intact distal pulses. Pulmonary/chest: Effort normal and breath sounds normal. No wheezing, rales or rhonchi. Abdominal: Soft and thin, left middle and lower part of abdominal discomfort to deep palpation without rebound or guarding, nondistended. Bowel sounds active throughout. Extremities: no clubbing, cyanosis, or edema Neurological: Alert and oriented to person place and time. Skin: Skin is warm and dry. No rashes noted. Psychiatric: Normal mood and affect. Behavior is normal.  RELEVANT LABS AND IMAGING: CBC    Component Value Date/Time   WBC 6.4 10/30/2012 1141   RBC 4.15* 10/30/2012 1141   HGB 12.9* 10/30/2012 1141   HCT 38.1* 10/30/2012 1141   PLT 233.0 10/30/2012 1141   MCV 91.8 10/30/2012 1141   MCH 30.9 04/11/2011 1030   MCHC 34.0 10/30/2012 1141   RDW 12.9 10/30/2012 1141   LYMPHSABS 1.4 03/01/2012 1519   MONOABS 0.5 03/01/2012 1519   EOSABS 0.2 03/01/2012 1519   BASOSABS 0.0 03/01/2012 1519    CMP     Component Value Date/Time   NA 140 10/30/2012 1141   K 4.6 10/30/2012 1141   CL 104 10/30/2012 1141   CO2 30 10/30/2012 1141   GLUCOSE 69* 10/30/2012 1141   BUN 22 10/30/2012 1141    CREATININE 1.2 10/30/2012 1141   CALCIUM 9.4 10/30/2012 1141   PROT 6.8 10/30/2012 1141   ALBUMIN 3.9 10/30/2012 1141   AST 21 10/30/2012 1141   ALT 21 10/30/2012 1141   ALKPHOS 52 10/30/2012 1141   BILITOT 0.6 10/30/2012 1141   GFRNONAA 54* 04/11/2011 1030   GFRAA 63* 04/11/2011 1030    ASSESSMENT/PLAN: 79 yo male with PMH of CAD, dyslipidemia, osteoporosis, peripheral vascular disease, lumbar spondylosis, kidney stones, hypertension and hiatal hernia who is seen in consultation at the request of Dr. Excell Seltzer for evaluation of weight loss  and abdominal pain.  1.  Weight loss/abd pain -- he has abdominal pain with palpation in the left middle and lower abdomen which is not present unless he is examined. This is not the pain that he feels high and his abdomen, in the epigastrium, which is associated with his vertigo type symptoms. The epigastric abdominal pain and vertigo symptoms have improved after stopping lisinopril. His weight loss is unexplained at this point. Given the tenderness felt on exam and his weight loss, I have recommended labs today to include CBC, CMP and TSH. I've also recommended CT scan of the abdomen and pelvis. We briefly discussed upper endoscopy and colonoscopy, but at this time, based on his age and comorbidities we will not proceed directly to endoscopy. He understands this recommendation and would like to avoid procedures if possible. He does have a large hiatal hernia seen by previous imaging, though this has been relatively asymptomatic. He does not have dysphagia symptoms nor early satiety or lower chest pain. I will see him back in 4-6 weeks for reassessment. I have encouraged him to drink over-the-counter nutritional supplements such as Ensure or boost on a daily basis if possible

## 2012-10-31 ENCOUNTER — Ambulatory Visit: Payer: Medicare Other | Admitting: Cardiovascular Disease

## 2012-10-31 ENCOUNTER — Ambulatory Visit (INDEPENDENT_AMBULATORY_CARE_PROVIDER_SITE_OTHER)
Admission: RE | Admit: 2012-10-31 | Discharge: 2012-10-31 | Disposition: A | Discharge status: Home / Self Care | Payer: Medicare Other | Source: Ambulatory Visit | Attending: Internal Medicine | Admitting: Internal Medicine

## 2012-10-31 DIAGNOSIS — R10819 Abdominal tenderness, unspecified site: Secondary | ICD-10-CM

## 2012-10-31 DIAGNOSIS — R634 Abnormal weight loss: Secondary | ICD-10-CM

## 2012-10-31 MED ORDER — IOHEXOL 300 MG/ML  SOLN
100.0000 mL | Freq: Once | INTRAMUSCULAR | Status: AC | PRN
  Administered 2012-10-31: 100 mL via INTRAVENOUS

## 2012-11-01 ENCOUNTER — Telehealth: Payer: Self-pay | Admitting: Internal Medicine

## 2012-11-01 MED ORDER — ESOMEPRAZOLE MAGNESIUM 40 MG PO CPDR: 40.0000 mg | DELAYED_RELEASE_CAPSULE | Freq: Every day | ORAL | Status: DC

## 2012-11-01 NOTE — Telephone Encounter (Signed)
Pt request refill for Nexium. Please advise.

## 2012-11-01 NOTE — Telephone Encounter (Signed)
Called the patient informed rx sent in as requested

## 2013-03-06 ENCOUNTER — Ambulatory Visit (INDEPENDENT_AMBULATORY_CARE_PROVIDER_SITE_OTHER): Payer: Medicare Other | Admitting: Internal Medicine

## 2013-03-06 ENCOUNTER — Encounter: Payer: Self-pay | Admitting: Internal Medicine

## 2013-03-06 VITALS — BP 140/84 | HR 80 | Temp 97.6°F | Ht 67.5 in | Wt 118.2 lb

## 2013-03-06 DIAGNOSIS — Z23 Encounter for immunization: Secondary | ICD-10-CM

## 2013-03-06 DIAGNOSIS — M545 Low back pain, unspecified: Secondary | ICD-10-CM

## 2013-03-06 DIAGNOSIS — I10 Essential (primary) hypertension: Secondary | ICD-10-CM

## 2013-03-06 DIAGNOSIS — R634 Abnormal weight loss: Secondary | ICD-10-CM

## 2013-03-06 DIAGNOSIS — E785 Hyperlipidemia, unspecified: Secondary | ICD-10-CM

## 2013-03-06 DIAGNOSIS — G8929 Other chronic pain: Secondary | ICD-10-CM

## 2013-03-06 DIAGNOSIS — R350 Frequency of micturition: Secondary | ICD-10-CM | POA: Insufficient documentation

## 2013-03-06 MED ORDER — TOLTERODINE TARTRATE ER 4 MG PO CP24: 4.0000 mg | ORAL_CAPSULE | Freq: Every day | ORAL | Status: AC

## 2013-03-06 NOTE — Assessment & Plan Note (Signed)
stable overall by history and exam, recent data reviewed with pt, and pt to continue medical treatment as before,  to f/u any worsening symptoms or concerns Lab Results  Component Value Date   LDLCALC 61 01/25/2010

## 2013-03-06 NOTE — Assessment & Plan Note (Addendum)
4 mo, o/w asympt, s/p recent ureteral stenting with removal, prob c/w OAB  - for detrol la qd  Note:  Total time for pt hx, exam, review of record with pt in the room, determination of diagnoses and plan for further eval and tx is > 40 min, with over 50% spent in coordination and counseling of patient

## 2013-03-06 NOTE — Progress Notes (Signed)
Subjective:    Patient ID: [name] [name], male    DOB: May 31, 1924, 80 y.o.   MRN: [id number]  HPI  Here to f/u; overall doing ok,  Pt denies chest pain, increased sob or doe, wheezing, orthopnea, PND, increased LE swelling, palpitations, dizziness or syncope.  Pt denies polydipsia, polyuria, or low sugar symptoms such as weakness or confusion improved with po intake.  Pt denies new neurological symptoms such as new headache, or facial or extremity weakness or numbness.   Pt states overall good compliance with meds, has been trying to follow lower cholesterol diet.  Has had some wt loss to 113, now back up to 118.  Pt continues to have recurring LBP without change in severity, bowel change, fever, wt loss,  worsening LE pain/numbness/weakness, gait change or falls. Also with bilat shoulder pain he attributes to bursitis on left, and known right rot cuff injury.  Taking 1 alleve per day for pain.  Off crestor per card per pt. Does have fairly freq urination up to 7-8 times per day, only once at night x 3-4 months Past Medical History  Diagnosis Date  . ALLERGIC RHINITIS   . CORONARY ARTERY DISEASE   . DYSLIPIDEMIA   . ERECTILE DYSFUNCTION, ORGANIC   . GERD   . LUNG NODULE 01/25/2010  . MACULAR DEGENERATION, BILATERAL 01/30/2009  . OSTEOPOROSIS   . PERIPHERAL VASCULAR DISEASE   . Plantar wart 09/10/2009  . ROTATOR CUFF TEAR 11/01/2006  . SPONDYLOSIS, LUMBAR 11/01/2006  . Kidney stones     right  . Blood in urine   . H/O hiatal hernia   . HYPERTENSION     dr Excell Seltzercooper   Past Surgical History  Procedure Laterality Date  . Cataract extraction, bilateral    . Tonsillectomy    . Inguinal herniorrhapy bilateral    . Ganglion cyst excision  1960  . Cystoscopy with ureteroscopy  11/23/2010    Procedure: CYSTOSCOPY WITH URETEROSCOPY;  Surgeon: Antony HasteMatthew Ramsey Eskridge, MD;  [facility];  Service: Urology;  Laterality: Right;  Prostate exam under anesthesia   . Stone  extraction with basket  11/23/2010    Procedure: STONE EXTRACTION WITH BASKET;  Surgeon: Antony HasteMatthew Ramsey Eskridge, MD;  [facility] [place];  Service: Urology;  Laterality: Right;  . Hernia repair    . Cardiac catheterization      06     [place].  . Eye surgery    . Inguinal hernia repair  04/15/2011    Procedure: HERNIA REPAIR INGUINAL ADULT;  Surgeon: Shelly Rubensteinouglas A Blackman, MD;  Location: [facility] OR;  Service: General;  Laterality: Right;  right inguinal hernia repair with mesh    reports that he has never smoked. He has never used smokeless tobacco. He reports that he drinks about 0.6 ounces of alcohol per week. He reports that he does not use illicit drugs. family history is not on file. Allergies  Allergen Reactions  . Aciphex [Rabeprazole]   . Clarithromycin     Flushed face  . Protonix [Pantoprazole Sodium]   . Tadalafil     Flushed face   Current Outpatient Prescriptions on File Prior to Visit  Medication Sig Dispense Refill  . aspirin 81 MG tablet Take 81 mg by mouth daily.        . cholecalciferol (VITAMIN D) 1000 UNITS tablet Take 1,000 Units by mouth daily.        Marland Kitchen. esomeprazole (NEXIUM) 40 MG capsule Take 1 capsule (40 mg  total) by mouth daily.  90 capsule  3  . meclizine (ANTIVERT) 12.5 MG tablet Take 1 tablet (12.5 mg total) by mouth 3 (three) times daily as needed for dizziness or nausea.  30 tablet  6  . Multiple Vitamins-Minerals (CENTRUM SILVER PO) Take 1 tablet by mouth daily.      . nitroGLYCERIN (NITROSTAT) 0.4 MG SL tablet Place 1 tablet (0.4 mg total) under the tongue every 5 (five) minutes as needed. For chest pain  25 tablet  6  . traMADol (ULTRAM) 50 MG tablet Take 2 tablets (100 mg total) by mouth 2 (two) times daily as needed for pain.  60 tablet  5   No current facility-administered medications on file prior to visit.   Review of Systems  Constitutional: Negative for unexpected weight change, or unusual diaphoresis  HENT: Negative for tinnitus.     Eyes: Negative for photophobia and visual disturbance.  Respiratory: Negative for choking and stridor.   Gastrointestinal: Negative for vomiting and blood in stool.  Genitourinary: Negative for hematuria and decreased urine volume.  Musculoskeletal: Negative for acute joint swelling Skin: Negative for color change and wound.  Neurological: Negative for tremors and numbness other than noted  Psychiatric/Behavioral: Negative for decreased concentration or  hyperactivity.       Objective:   Physical Exam BP 140/84  Pulse 80  Temp(Src) 97.6 F (36.4 C) (Oral)  Ht 5' 7.5" (1.715 m)  Wt 118 lb 4 oz (53.638 kg)  BMI 18.24 kg/m2  SpO2 98% VS noted, not ill appearing Constitutional: Pt appears well-developed and well-nourished.  HENT: Head: NCAT.  Right Ear: External ear normal.  Left Ear: External ear normal.  Eyes: Conjunctivae and EOM are normal. Pupils are equal, round, and reactive to light.  Neck: Normal range of motion. Neck supple.  Cardiovascular: Normal rate and regular rhythm.   Pulmonary/Chest: Effort normal and breath sounds normal.  Neurological: Pt is alert. Not confused , motor 5/5 Skin: Skin is warm. No erythema.  Spine marked scoliosis, nontender Psychiatric: Pt behavior is normal. Thought content normal.     Assessment & Plan:

## 2013-03-06 NOTE — Assessment & Plan Note (Signed)
stable overall by history and exam, recent data reviewed with pt, and pt to continue medical treatment as before,  to f/u any worsening symptoms or concerns BP Readings from Last 3 Encounters:  03/06/13 140/84  10/30/12 132/70  10/24/12 137/74

## 2013-03-06 NOTE — Assessment & Plan Note (Signed)
stable overall by history and exam, and pt to continue medical treatment as before,  to f/u any worsening symptoms or concerns 

## 2013-03-06 NOTE — Progress Notes (Signed)
Pre visit review using our clinic review tool, if applicable. No additional management support is needed unless otherwise documented below in the visit note. 

## 2013-03-06 NOTE — Assessment & Plan Note (Signed)
Improved recent with approx 5 lbs wt gain, cont ensure,  to f/u any worsening symptoms or concerns

## 2013-03-06 NOTE — Patient Instructions (Addendum)
You had the new Prevnar pneumonia shot today  Please take all new medication as prescribed - the detrol for overactive bladder;  Please keep your appointments with your specialists as you may have planned - urology  Please continue all other medications as before, and refills have been done if requested. Please have the pharmacy call with any other refills you may need.  Please go to the LAB in the Basement (turn left off the elevator) for the tests to be done today You will be contacted by phone if any changes need to be made immediately.  Otherwise, you will receive a letter about your results with an explanation, but please check with MyChart first.  Please return in 6 months, or sooner if needed

## 2013-04-08 ENCOUNTER — Encounter: Payer: Self-pay | Admitting: Internal Medicine

## 2013-06-14 ENCOUNTER — Other Ambulatory Visit: Payer: Self-pay | Admitting: *Deleted

## 2013-06-14 MED ORDER — ESOMEPRAZOLE MAGNESIUM 40 MG PO CPDR: 40.0000 mg | DELAYED_RELEASE_CAPSULE | Freq: Every day | ORAL | Status: DC

## 2013-06-25 ENCOUNTER — Other Ambulatory Visit: Payer: Self-pay | Admitting: Internal Medicine

## 2013-06-25 NOTE — Telephone Encounter (Signed)
Done hardcopy to robin  

## 2013-06-25 NOTE — Telephone Encounter (Signed)
Faxed hardcopy to [facility] [address] [place] 

## 2013-07-15 ENCOUNTER — Telehealth: Payer: Self-pay

## 2013-07-15 NOTE — Telephone Encounter (Signed)
Received PA approval for Nexium 40 mg capsules from 05/15/2013 through 07/16/2014.  Will receive fax letter of approval.

## 2013-07-30 ENCOUNTER — Encounter: Payer: Self-pay | Admitting: Physician Assistant

## 2013-07-30 ENCOUNTER — Telehealth: Payer: Self-pay | Admitting: Cardiovascular Disease

## 2013-07-30 ENCOUNTER — Ambulatory Visit (INDEPENDENT_AMBULATORY_CARE_PROVIDER_SITE_OTHER): Payer: Medicare Other | Admitting: Physician Assistant

## 2013-07-30 VITALS — BP 145/80 | HR 60 | Ht 67.5 in | Wt 117.0 lb

## 2013-07-30 DIAGNOSIS — R42 Dizziness and giddiness: Secondary | ICD-10-CM

## 2013-07-30 DIAGNOSIS — K219 Gastro-esophageal reflux disease without esophagitis: Secondary | ICD-10-CM

## 2013-07-30 DIAGNOSIS — I2510 Atherosclerotic heart disease of native coronary artery without angina pectoris: Secondary | ICD-10-CM

## 2013-07-30 DIAGNOSIS — R079 Chest pain, unspecified: Secondary | ICD-10-CM

## 2013-07-30 DIAGNOSIS — I10 Essential (primary) hypertension: Secondary | ICD-10-CM

## 2013-07-30 MED ORDER — ESOMEPRAZOLE MAGNESIUM 40 MG PO CPDR: 40.0000 mg | DELAYED_RELEASE_CAPSULE | Freq: Every day | ORAL | Status: DC

## 2013-07-30 NOTE — Progress Notes (Signed)
Cardiology Office Note    Date:  07/30/2013   ID:  [name], DOB 07-05-1924, MRN [id number]  PCP:  Oliver Barre, MD  Cardiologist:  Dr. Tonny Bollman      History of Present Illness: [name] [name] is a 80 y.o. male with a hx of non-obstructive CAD by cath in 2006, HTN, HL.  Last seen by Dr. Tonny Bollman in 10/2012.    Patient called in today with complaints of chest pain and was added on to my schedule for evaluation.  He had been in his usual state of health until the last few weeks. He has noted increasing fatigue. Weight loss has tapered off. Last night, while reading, he developed chest discomfort described as a pressure. It lasted 1 minute or less. He denies any radiating symptoms. He thinks he was short of breath with this. He denies associated nausea, diaphoresis or syncope. He did take NTG x 1.  This may have helped.  He denies exertional chest pain or significant exertional shortness of breath. He is NYHA class 2-2b.  He denies orthopnea, PND or edema.   Studies:  - LHC (2006):  LAD 50%  - Echo (5/11):  EF 55-60%, no RWMA, Gr 1 DD, mild AI, MAC, mild LAE, atrial septal lipomatous hypertrophy  - Nuclear (10/11):  EF 64%, normal study   Recent Labs: 10/30/2012: ALT 21; Creatinine 1.2; Hemoglobin 12.9*; Potassium 4.6; TSH 1.17   Wt Readings from Last 3 Encounters:  03/06/13 118 lb 4 oz (53.638 kg)  10/30/12 120 lb (54.432 kg)  10/24/12 120 lb (54.432 kg)     Past Medical History  Diagnosis Date  . ALLERGIC RHINITIS   . CORONARY ARTERY DISEASE   . DYSLIPIDEMIA   . ERECTILE DYSFUNCTION, ORGANIC   . GERD   . LUNG NODULE 01/25/2010  . MACULAR DEGENERATION, BILATERAL 01/30/2009  . OSTEOPOROSIS   . PERIPHERAL VASCULAR DISEASE   . Plantar wart 09/10/2009  . ROTATOR CUFF TEAR 11/01/2006  . SPONDYLOSIS, LUMBAR 11/01/2006  . Kidney stones     right  . Blood in urine   . H/O hiatal hernia   . HYPERTENSION     dr cooper    Current Outpatient Prescriptions    Medication Sig Dispense Refill  . aspirin 81 MG tablet Take 81 mg by mouth daily.        . cholecalciferol (VITAMIN D) 1000 UNITS tablet Take 1,000 Units by mouth daily.        Marland Kitchen esomeprazole (NEXIUM) 40 MG capsule Take 1 capsule (40 mg total) by mouth daily.  90 capsule  3  . meclizine (ANTIVERT) 12.5 MG tablet Take 1 tablet (12.5 mg total) by mouth 3 (three) times daily as needed for dizziness or nausea.  30 tablet  6  . Multiple Vitamins-Minerals (CENTRUM SILVER PO) Take 1 tablet by mouth daily.      . nitroGLYCERIN (NITROSTAT) 0.4 MG SL tablet Place 1 tablet (0.4 mg total) under the tongue every 5 (five) minutes as needed. For chest pain  25 tablet  6  . tolterodine (DETROL LA) 4 MG 24 hr capsule Take 1 capsule (4 mg total) by mouth daily.  90 capsule  3  . traMADol (ULTRAM) 50 MG tablet TAKE 2 TABLETS BY MOUTH TWICE DAILY AS NEEDED  60 tablet  3   No current facility-administered medications for this visit.    Allergies:   Aciphex; Clarithromycin; Protonix; and Tadalafil   Social History:  The patient  reports  that he has never smoked. He has never used smokeless tobacco. He reports that he drinks about .6 ounces of alcohol per week. He reports that he does not use illicit drugs.   Family History:  The patient's family history is not on file.   ROS:  Please see the history of present illness.   He remains dizzy. This is described as a spinning sensation that generally occurs with changes in head positioning.   All other systems reviewed and negative.   PHYSICAL EXAM: VS:  BP 145/80  Pulse 60  Ht 5' 7.5" (1.715 m)  Wt 117 lb (53.071 kg)  BMI 18.04 kg/m2 Well nourished, well developed, in no acute distress HEENT: normal Neck: no JVD Cardiac:  normal S1, S2; RRR; no murmur Lungs:  clear to auscultation bilaterally, no wheezing, rhonchi or rales Abd: soft, nontender, no hepatomegaly Ext: no edema Skin: warm and dry Neuro:  CNs 2-12 intact, no focal abnormalities noted  EKG:   NSR, HR 60, normal axis, RBBB, no change from prior tracing     ASSESSMENT AND PLAN:  Chest pain, unspecified:  Symptoms are atypical. His PPI had changed in the last few months. [facility]. His insurance will now cover Nexium.  He does have a history of nonobstructive CAD by cardiac catheterization almost 10 years ago.  He has noted increasing fatigue recently. He could not walk on a treadmill given his chronic back pain. Arrange Lexiscan Myoview. Refill Nexium 40 mg daily.  Coronary artery disease:  Proceed with Myoview as noted. Continue aspirin.  Dizziness:  Symptoms are consistent with benign positional vertigo. I have encouraged him to follow up with primary care for possible referral to physical therapy/vestibular rehabilitation.  HYPERTENSION:  Controlled.  Gastroesophageal reflux disease:  Refill Nexium as noted above.  Disposition:  Follow up with Dr. Excell Seltzerooper as planned in October.   Signed, Brynda RimScott Jameya Pontiff, PA-C, MHS 07/30/2013 10:19 AM    [facility] [address], [place], [place]  [address] Phone: [phone]; Fax: [phone]

## 2013-07-30 NOTE — Patient Instructions (Signed)
A REFILL FOR NEXIUM HAS BEEN SENT IN TODAY  Your physician has requested that you have a lexiscan myoview. For further information please visit [email or web address]. Please follow instruction sheet, as given.  FOLLOW UP WITH DR. Excell SeltzerOOPER IN 10/2013

## 2013-07-30 NOTE — Telephone Encounter (Signed)
Patient had two episodes last night of acute CP that was a pressure sensation lasting a couple of minutes accompanied by SOB and dizziness.  Patient also having episodes that continue of syncope. This has been an ongoing problem. Patient took Nitro during CP which "eased" the pain for a couple of hours but then the pain returned. Patient states he feels his BP is elevated. He states that he "almost called 911 but hoped I could hold off until talking with Dr. Excell Seltzerooper". Reviewed by Dr. Excell Seltzerooper. Patient placed on today's schedule to see Tereso NewcomerScott Weaver, PA, at 10:30 am. Patient verbalized agreement and appreciation to be seen at 10:30 am today.

## 2013-07-30 NOTE — Telephone Encounter (Signed)
New problem    Pt has chest pains last night around 6 pm and again 8 pm  would like to come in today.   Please give pt a call.

## 2013-08-02 ENCOUNTER — Encounter: Payer: Self-pay | Admitting: Cardiovascular Disease

## 2013-08-08 ENCOUNTER — Ambulatory Visit (HOSPITAL_COMMUNITY): Payer: Medicare Other | Attending: Cardiology | Admitting: Radiology

## 2013-08-08 VITALS — BP 126/74 | Ht 68.0 in | Wt 113.0 lb

## 2013-08-08 DIAGNOSIS — R5383 Other fatigue: Secondary | ICD-10-CM | POA: Diagnosis not present

## 2013-08-08 DIAGNOSIS — I2510 Atherosclerotic heart disease of native coronary artery without angina pectoris: Secondary | ICD-10-CM

## 2013-08-08 DIAGNOSIS — R0789 Other chest pain: Secondary | ICD-10-CM

## 2013-08-08 DIAGNOSIS — R5381 Other malaise: Secondary | ICD-10-CM | POA: Insufficient documentation

## 2013-08-08 DIAGNOSIS — R079 Chest pain, unspecified: Secondary | ICD-10-CM | POA: Insufficient documentation

## 2013-08-08 DIAGNOSIS — Z8249 Family history of ischemic heart disease and other diseases of the circulatory system: Secondary | ICD-10-CM | POA: Insufficient documentation

## 2013-08-08 DIAGNOSIS — R0602 Shortness of breath: Secondary | ICD-10-CM | POA: Diagnosis not present

## 2013-08-08 DIAGNOSIS — R42 Dizziness and giddiness: Secondary | ICD-10-CM | POA: Diagnosis not present

## 2013-08-08 MED ORDER — TECHNETIUM TC 99M SESTAMIBI GENERIC - CARDIOLITE
30.0000 | Freq: Once | INTRAVENOUS | Status: AC | PRN
  Administered 2013-08-08: 30 via INTRAVENOUS

## 2013-08-08 MED ORDER — TECHNETIUM TC 99M SESTAMIBI GENERIC - CARDIOLITE
10.0000 | Freq: Once | INTRAVENOUS | Status: AC | PRN
  Administered 2013-08-08: 10 via INTRAVENOUS

## 2013-08-08 MED ORDER — REGADENOSON 0.4 MG/5ML IV SOLN
0.4000 mg | Freq: Once | INTRAVENOUS | Status: AC
  Administered 2013-08-08: 0.4 mg via INTRAVENOUS

## 2013-08-08 NOTE — Progress Notes (Signed)
[facility] SITE [facility] NUCLEAR MED [address] [place], [place] [address] [phone]    Cardiology Nuclear Med Study  [name] Rothmanorman L [name] is a [age] y.o. male     MRN : [id number]     DOB: [birth year]  Procedure Date: 08/08/2013  Nuclear Med Background Indication for Stress Test:  Evaluation for Ischemia History:  CAD-CATH-N/O DZ 2006  2011 ECHO: EF: 55-60% MPI: NL EF: 64% Cardiac Risk Factors: Family History - CAD, Hypertension, Lipids, PVD and RBBB  Symptoms:  Chest Pain, Dizziness, Fatigue and SOB   Nuclear Pre-Procedure Caffeine/Decaff Intake:  None > 12 hrs NPO After: 7:00pm   Lungs:  clear O2 Sat: 97% on room air. IV 0.9% NS with Angio Cath:  22g  IV Site: R Antecubital x 1, tolerated well IV Started by:  Irean HongPatsy Edwards, RN  Chest Size (in):  36 Cup Size: n/a  Height: 5\' 8"  (1.727 m)  Weight:  113 lb (51.256 kg)  BMI:  Body mass index is 17.19 kg/(m^2). Tech Comments:  N/A    Nuclear Med Study 1 or 2 day study: 1 day  Stress Test Type:  Lexiscan  Reading MD: N/A  Order Authorizing Provider:  Tonny BollmanMichael Cooper, MD, and Tereso NewcomerScott Weaver, [facility]  Resting Radionuclide: Technetium 3951m Sestamibi  Resting Radionuclide Dose: 11.0 mCi   Stress Radionuclide:  Technetium 6351m Sestamibi  Stress Radionuclide Dose: 33.0 mCi           Stress Protocol Rest HR: 60 Stress HR: 83  Rest BP: 126/74 Stress BP: 140/78  Exercise Time (min): n/a METS: n/a   Predicted Max HR: 133 bpm % Max HR: 62.41 bpm Rate Pressure Product: [id number]   Dose of Adenosine (mg):  n/a Dose of Lexiscan: 0.4 mg  Dose of Atropine (mg): n/a Dose of Dobutamine: n/a mcg/kg/min (at max HR)  Stress Test Technologist: Milana NaSabrina Williams, EMT-P  Nuclear Technologist:  Doyne Keelonya Yount, CNMT     Rest Procedure:  Myocardial perfusion imaging was performed at rest 45 minutes following the intravenous administration of Technetium 251m Sestamibi. Rest ECG: NSR - Normal EKG  Stress Procedure:  The patient received IV Lexiscan 0.4 mg  over 15-seconds.  Technetium 6851m Sestamibi injected at 30-seconds. This patient had sob, nausea, and a headache with the Lexiscan injection. Quantitative spect images were obtained after a 45 minute delay. Stress ECG: No significant change from baseline ECG  QPS Raw Data Images:  There is interference from nuclear activity from structures below the diaphragm. This does not affect the ability to read the study. Stress Images:  Normal homogeneous uptake in all areas of the myocardium. Rest Images:  Globally decreased tracer uptake due to adjacent high bowel uptake Subtraction (SDS):  No evidence of ischemia. Transient Ischemic Dilatation (Normal <1.22):  0.79 Lung/Heart Ratio (Normal <0.45):  0.30  Quantitative Gated Spect Images QGS EDV:  73 ml QGS ESV:  42 ml  Impression Exercise Capacity:  Lexiscan with no exercise. BP Response:  Normal blood pressure response. Clinical Symptoms:  No significant symptoms noted. ECG Impression:  No significant ECG changes with Lexiscan. Comparison with Prior Nuclear Study: No images to compare  Overall Impression:  Normal stress nuclear study.  LV Ejection Fraction: Gating abnormality precludes accurate LVEF assessment.  Chrystie NoseKenneth C. Bonnye Halle, MD, [facility] Board Certified in Nuclear Cardiology Attending Cardiologist [facility]

## 2013-08-09 ENCOUNTER — Encounter: Payer: Self-pay | Admitting: Physician Assistant

## 2013-08-26 ENCOUNTER — Telehealth: Payer: Self-pay | Admitting: Cardiovascular Disease

## 2013-08-26 NOTE — Telephone Encounter (Signed)
New Message  Pt called requests a call back to determine if the follow up appt is needed being that his test results came back normal.. Please call back to discuss// sr

## 2013-08-26 NOTE — Telephone Encounter (Signed)
Pt saw Tereso Newcomer PA-C on 07/30/13 and myoview perfomed 08/08/13.  This was a normal myoview and the pt would like to know if he needs to see Dr Excell Seltzer 10/24/13.  I will forward this message to Dr Excell Seltzer to review and if the pt does not need to keep October appointment then I will need instruction as to when the pt needs to be seen again.

## 2013-08-26 NOTE — Telephone Encounter (Signed)
Would be ok to follow-up one year out from his visit with Lorin Picket next Summer. thx

## 2013-08-27 NOTE — Telephone Encounter (Signed)
Pt aware of change in appointment.  

## 2013-09-06 ENCOUNTER — Encounter: Payer: Self-pay | Admitting: Internal Medicine

## 2013-09-06 ENCOUNTER — Other Ambulatory Visit (INDEPENDENT_AMBULATORY_CARE_PROVIDER_SITE_OTHER): Payer: Medicare Other

## 2013-09-06 ENCOUNTER — Ambulatory Visit (INDEPENDENT_AMBULATORY_CARE_PROVIDER_SITE_OTHER): Payer: Medicare Other | Admitting: Internal Medicine

## 2013-09-06 VITALS — BP 104/70 | HR 85 | Temp 97.7°F | Wt 116.0 lb

## 2013-09-06 DIAGNOSIS — I10 Essential (primary) hypertension: Secondary | ICD-10-CM

## 2013-09-06 DIAGNOSIS — M545 Low back pain, unspecified: Secondary | ICD-10-CM

## 2013-09-06 DIAGNOSIS — I2510 Atherosclerotic heart disease of native coronary artery without angina pectoris: Secondary | ICD-10-CM

## 2013-09-06 DIAGNOSIS — R1310 Dysphagia, unspecified: Secondary | ICD-10-CM | POA: Insufficient documentation

## 2013-09-06 DIAGNOSIS — E785 Hyperlipidemia, unspecified: Secondary | ICD-10-CM

## 2013-09-06 DIAGNOSIS — G8929 Other chronic pain: Secondary | ICD-10-CM

## 2013-09-06 DIAGNOSIS — Z23 Encounter for immunization: Secondary | ICD-10-CM

## 2013-09-06 LAB — HEPATIC FUNCTION PANEL
ALT: 14 U/L (ref 0–53)
AST: 20 U/L (ref 0–37)
Albumin: 3.9 g/dL (ref 3.5–5.2)
Alkaline Phosphatase: 57 U/L (ref 39–117)
Bilirubin, Direct: 0.1 mg/dL (ref 0.0–0.3)
TOTAL PROTEIN: 7.1 g/dL (ref 6.0–8.3)
Total Bilirubin: 0.6 mg/dL (ref 0.2–1.2)

## 2013-09-06 LAB — URINALYSIS, ROUTINE W REFLEX MICROSCOPIC
Bilirubin Urine: NEGATIVE
HGB URINE DIPSTICK: NEGATIVE
Ketones, ur: NEGATIVE
Leukocytes, UA: NEGATIVE
NITRITE: NEGATIVE
RBC / HPF: NONE SEEN (ref 0–?)
Specific Gravity, Urine: 1.015 (ref 1.000–1.030)
TOTAL PROTEIN, URINE-UPE24: NEGATIVE
Urine Glucose: NEGATIVE
Urobilinogen, UA: 0.2 (ref 0.0–1.0)
pH: 6 (ref 5.0–8.0)

## 2013-09-06 LAB — LIPID PANEL
Cholesterol: 228 mg/dL — ABNORMAL HIGH (ref 0–200)
HDL: 74.4 mg/dL (ref >39.00)
LDL Cholesterol: 138 mg/dL — ABNORMAL HIGH (ref 0–99)
NonHDL: 153.6
TRIGLYCERIDES: 78 mg/dL (ref 0.0–149.0)
Total CHOL/HDL Ratio: 3
VLDL: 15.6 mg/dL (ref 0.0–40.0)

## 2013-09-06 LAB — CBC WITH DIFFERENTIAL/PLATELET
BASOS PCT: 1.2 % (ref 0.0–3.0)
Basophils Absolute: 0.1 10*3/uL (ref 0.0–0.1)
EOS ABS: 0.2 10*3/uL (ref 0.0–0.7)
EOS PCT: 4 % (ref 0.0–5.0)
HCT: 35.4 % — ABNORMAL LOW (ref 39.0–52.0)
Hemoglobin: 11.7 g/dL — ABNORMAL LOW (ref 13.0–17.0)
LYMPHS PCT: 29.7 % (ref 12.0–46.0)
Lymphs Abs: 1.4 10*3/uL (ref 0.7–4.0)
MCHC: 33 g/dL (ref 30.0–36.0)
MCV: 86.2 fl (ref 78.0–100.0)
Monocytes Absolute: 0.5 10*3/uL (ref 0.1–1.0)
Monocytes Relative: 10.7 % (ref 3.0–12.0)
NEUTROS PCT: 54.4 % (ref 43.0–77.0)
Neutro Abs: 2.6 10*3/uL (ref 1.4–7.7)
Platelets: 264 10*3/uL (ref 150.0–400.0)
RBC: 4.11 Mil/uL — ABNORMAL LOW (ref 4.22–5.81)
RDW: 15.1 % (ref 11.5–15.5)
WBC: 4.9 10*3/uL (ref 4.0–10.5)

## 2013-09-06 LAB — BASIC METABOLIC PANEL
BUN: 26 mg/dL — AB (ref 6–23)
CHLORIDE: 107 meq/L (ref 96–112)
CO2: 30 meq/L (ref 19–32)
CREATININE: 1.2 mg/dL (ref 0.4–1.5)
Calcium: 9.3 mg/dL (ref 8.4–10.5)
GFR: 61.4 mL/min (ref >60.00)
GLUCOSE: 68 mg/dL — AB (ref 70–99)
Potassium: 4.5 mEq/L (ref 3.5–5.1)
Sodium: 141 mEq/L (ref 135–145)

## 2013-09-06 LAB — TSH: TSH: 0.93 u[IU]/mL (ref 0.35–4.50)

## 2013-09-06 MED ORDER — TRAMADOL HCL 50 MG PO TABS: ORAL_TABLET | ORAL | Status: DC

## 2013-09-06 NOTE — Assessment & Plan Note (Addendum)
With what sounds like recurring esoph spasm, ? Need EGD - for refer GI, Dr Rhea Belton  Note:  Total time for pt hx, exam, review of record with pt in the room, determination of diagnoses and plan for further eval and tx is > 40 min, with over 50% spent in coordination and counseling of patient

## 2013-09-06 NOTE — Progress Notes (Signed)
Subjective:    Patient ID: [name] [name], male    DOB: Aug 20, 1924, 80 y.o.   MRN: [id number]  HPI  Here for yearly f/u;  Overall doing ok;  Pt denies CP, worsening SOB, DOE, wheezing, orthopnea, PND, worsening LE edema, palpitations,  or syncope, but does have occas dizziness, spinnning type with lying down, lightheaded type with first getting up in the am.   Pt denies neurological change such as new headache, facial or extremity weakness.  Pt denies polydipsia, polyuria, or low sugar symptoms. Pt states overall good compliance with treatment and medications, good tolerability, and has been trying to follow lower cholesterol diet.  Pt denies worsening depressive symptoms, suicidal ideation or panic. No fever, night sweats, wt loss, loss of appetite, or other constitutional symptoms.  Pt states good ability with ADL's, has low fall risk, home safety reviewed and adequate, no other significant changes in hearing or vision, and only occasionally active with exercise.  More stress recently with daughter with recent surgury for breast cancer.Has no other cancer in family, and a brother now 93yo.  Has seen cardiology, now off lisinopril, even though the cardiologist who did his prior cath in [place] had told him it was a lifetime med.   Had neg nuc stress test here aug 6, neg for ischemia.  Still with recurring mid lower chest intermittent mild to mod "gripping" pain the then seems to go to the stomach.    Overall good compliance with treatment, and good medicine tolerability, incluidng the nexium 40 qd.  Has lost some wt, usual in past was 132, now 116, but no vomiting.  Does have some chronic mild dysphagia to solids,, no hx of aspiration or choking.  Pt continues to have recurring LBP without change in severity, bowel or bladder change, fever, wt loss,  worsening LE pain/numbness/weakness, gait change or falls.  Needs tramadol refill. For flu shot today.  Asks for PDE5  For ongoing ED, but cannot get due to  cost.   Has 2 new hearing aids provided by the [place]. Served in [employer] during [employer], as well as [place] (personnel/admin due to back injury at [employer]) Past Medical History  Diagnosis Date  . ALLERGIC RHINITIS   . CORONARY ARTERY DISEASE   . DYSLIPIDEMIA   . ERECTILE DYSFUNCTION, ORGANIC   . GERD   . LUNG NODULE 01/25/2010  . MACULAR DEGENERATION, BILATERAL 01/30/2009  . OSTEOPOROSIS   . PERIPHERAL VASCULAR DISEASE   . Plantar wart 09/10/2009  . ROTATOR CUFF TEAR 11/01/2006  . SPONDYLOSIS, LUMBAR 11/01/2006  . Kidney stones     right  . Blood in urine   . H/O hiatal hernia   . HYPERTENSION     dr Excell Seltzer  . History of cardiovascular stress test     Lexiscan Myoview (8/15): No ischemia, not gated, normal study   Past Surgical History  Procedure Laterality Date  . Cataract extraction, bilateral    . Tonsillectomy    . Inguinal herniorrhapy bilateral    . Ganglion cyst excision  1960  . Cystoscopy with ureteroscopy  11/23/2010    Procedure: CYSTOSCOPY WITH URETEROSCOPY;  Surgeon: Antony Haste, MD;  Location: [facility];  Service: Urology;  Laterality: Right;  Prostate exam under anesthesia   . Stone extraction with basket  11/23/2010    Procedure: STONE EXTRACTION WITH BASKET;  Surgeon: Antony Haste, MD;  Location[facility];  Service: Urology;  Laterality: Right;  .  Hernia repair    . Cardiac catheterization      06     fla.  . Eye surgery    . Inguinal hernia repair  04/15/2011    Procedure: HERNIA REPAIR INGUINAL ADULT;  Surgeon: Shelly Rubenstein, MD;  Location: [facility] OR;  Service: General;  Laterality: Right;  right inguinal hernia repair with mesh    reports that he has never smoked. He has never used smokeless tobacco. He reports that he drinks about .6 ounces of alcohol per week. He reports that he does not use illicit drugs. family history includes Cancer in his brother; Heart attack in his brother,  father, and mother. Allergies  Allergen Reactions  . Aciphex [Rabeprazole]   . Clarithromycin     Flushed face  . Protonix [Pantoprazole Sodium]   . Tadalafil     Flushed face   Current Outpatient Prescriptions on File Prior to Visit  Medication Sig Dispense Refill  . aspirin 81 MG tablet Take 81 mg by mouth daily.        . cholecalciferol (VITAMIN D) 1000 UNITS tablet Take 1,000 Units by mouth daily.        Marland Kitchen esomeprazole (NEXIUM) 40 MG capsule Take 1 capsule (40 mg total) by mouth daily.  30 capsule  5  . meclizine (ANTIVERT) 12.5 MG tablet Take 1 tablet (12.5 mg total) by mouth 3 (three) times daily as needed for dizziness or nausea.  30 tablet  6  . Multiple Vitamins-Minerals (CENTRUM SILVER PO) Take 1 tablet by mouth daily.      . nitroGLYCERIN (NITROSTAT) 0.4 MG SL tablet Place 1 tablet (0.4 mg total) under the tongue every 5 (five) minutes as needed. For chest pain  25 tablet  6  . tolterodine (DETROL LA) 4 MG 24 hr capsule Take 1 capsule (4 mg total) by mouth daily.  90 capsule  3  . traMADol (ULTRAM) 50 MG tablet TAKE 2 TABLETS BY MOUTH TWICE DAILY AS NEEDED  60 tablet  3   No current facility-administered medications on file prior to visit.   Review of Systems Constitutional: Negative for increased diaphoresis, other activity, appetite or other siginficant weight change  HENT: Negative for worsening hearing loss, ear pain, facial swelling, mouth sores and neck stiffness.   Eyes: Negative for other worsening pain, redness or visual disturbance.  Respiratory: Negative for shortness of breath and wheezing.   Cardiovascular: Negative for chest pain and palpitations.  Gastrointestinal: Negative for diarrhea, blood in stool, abdominal distention or other pain Genitourinary: Negative for hematuria, flank pain or change in urine volume.  Musculoskeletal: Negative for myalgias or other joint complaints.  Skin: Negative for color change and wound.  Neurological: Negative for syncope  and numbness. other than noted Hematological: Negative for adenopathy. or other swelling Psychiatric/Behavioral: Negative for hallucinations, self-injury, decreased concentration or other worsening agitation.      Objective:   Physical Exam BP 104/70  Pulse 85  Temp(Src) 97.7 F (36.5 C) (Oral)  Wt 116 lb (52.617 kg)  SpO2 98% VS noted,  Constitutional: Pt is oriented to person, place, and time. Appears well-developed and well-nourished.  Head: Normocephalic and atraumatic.  Right Ear: External ear normal.  Left Ear: External ear normal.  Nose: Nose normal.  Mouth/Throat: Oropharynx is clear and moist.  Eyes: Conjunctivae and EOM are normal. Pupils are equal, round, and reactive to light.  Neck: Normal range of motion. Neck supple. No JVD present. No tracheal deviation present.  Cardiovascular: Normal rate, regular  rhythm, normal heart sounds and intact distal pulses.   Pulmonary/Chest: Effort normal and breath sounds without rales or wheezing  Abdominal: Soft. Bowel sounds are normal. NT. No HSM  Musculoskeletal: Normal range of motion. Exhibits no edema.  Lymphadenopathy:  Has no cervical adenopathy.  Neurological: Pt is alert and oriented to person, place, and time. Pt has normal reflexes. No cranial nerve deficit. Motor grossly intact Skin: Skin is warm and dry. No rash noted.  Psychiatric:  Has normal mood and affect. Behavior is normal.     Assessment & Plan:

## 2013-09-06 NOTE — Patient Instructions (Signed)
Please continue all other medications as before, and refills have been done if requested - the tramadol  Please have the pharmacy call with any other refills you may need.  Please continue your efforts at being more active, low cholesterol diet, and weight control.  You are otherwise up to date with prevention measures today.  Please keep your appointments with your specialists as you may have planned  You will be contacted regarding the referral for: Gastroenterology, Dr Rhea Belton  Please go to the LAB in the Basement (turn left off the elevator) for the tests to be done today  You will be contacted by phone if any changes need to be made immediately.  Otherwise, you will receive a letter about your results with an explanation, but please check with MyChart first.  Please remember to sign up for MyChart if you have not done so, as this will be important to you in the future with finding out test results, communicating by private email, and scheduling acute appointments online when needed.  Please return in 6 months, or sooner if needed

## 2013-09-06 NOTE — Progress Notes (Signed)
Pre visit review using our clinic review tool, if applicable. No additional management support is needed unless otherwise documented below in the visit note. 

## 2013-09-07 NOTE — Assessment & Plan Note (Signed)
stable overall by history and exam, recent data reviewed with pt, and pt to continue medical treatment as before,  to f/u any worsening symptoms or concerns BP Readings from Last 3 Encounters:  09/06/13 104/70  08/08/13 126/74  07/30/13 145/80

## 2013-09-07 NOTE — Assessment & Plan Note (Signed)
stable overall by history and exam, recent data reviewed with pt, and pt to continue medical treatment as before,  to f/u any worsening symptoms or concerns Lab Results  Component Value Date   LDLCALC 138* 09/06/2013   For lower chol diet

## 2013-09-07 NOTE — Assessment & Plan Note (Signed)
Stable, for tramadol prn

## 2013-09-10 ENCOUNTER — Telehealth: Payer: Self-pay | Admitting: Internal Medicine

## 2013-09-13 ENCOUNTER — Encounter: Payer: Self-pay | Admitting: Gastroenterology

## 2013-10-04 ENCOUNTER — Telehealth: Payer: Self-pay | Admitting: Internal Medicine

## 2013-10-04 MED ORDER — NITROGLYCERIN 0.4 MG SL SUBL: 0.4000 mg | SUBLINGUAL_TABLET | SUBLINGUAL | Status: AC | PRN

## 2013-10-04 NOTE — Telephone Encounter (Signed)
Refill done and patient notified. 

## 2013-10-04 NOTE — Telephone Encounter (Signed)
Patient called requesting new rx for nitrostat. He uses [facility] on [address]

## 2013-10-24 ENCOUNTER — Ambulatory Visit: Payer: Medicare Other | Admitting: Cardiovascular Disease

## 2013-11-12 ENCOUNTER — Ambulatory Visit: Payer: Medicare Other | Admitting: Gastroenterology

## 2013-11-13 ENCOUNTER — Other Ambulatory Visit: Payer: Self-pay | Admitting: Internal Medicine

## 2013-11-15 ENCOUNTER — Telehealth: Payer: Self-pay | Admitting: Internal Medicine

## 2013-11-15 NOTE — Telephone Encounter (Signed)
Pt arrived with lab work, had recent esr 58 (prior had been 58)  OK for ROV next available, does not need emergency visit

## 2013-11-15 NOTE — Telephone Encounter (Signed)
Left message with wife?  To call back to schedule.

## 2013-11-15 NOTE — Telephone Encounter (Signed)
[place] [facility] [place]  [facility] was 10.4 ?  [facility].

## 2013-11-19 ENCOUNTER — Ambulatory Visit (INDEPENDENT_AMBULATORY_CARE_PROVIDER_SITE_OTHER): Payer: Medicare Other | Admitting: Internal Medicine

## 2013-11-19 ENCOUNTER — Other Ambulatory Visit (INDEPENDENT_AMBULATORY_CARE_PROVIDER_SITE_OTHER): Payer: Medicare Other

## 2013-11-19 ENCOUNTER — Encounter: Payer: Self-pay | Admitting: Internal Medicine

## 2013-11-19 VITALS — BP 132/78 | HR 93 | Temp 98.1°F | Ht 67.5 in | Wt 119.0 lb

## 2013-11-19 DIAGNOSIS — I2510 Atherosclerotic heart disease of native coronary artery without angina pectoris: Secondary | ICD-10-CM

## 2013-11-19 DIAGNOSIS — D649 Anemia, unspecified: Secondary | ICD-10-CM

## 2013-11-19 DIAGNOSIS — R5383 Other fatigue: Secondary | ICD-10-CM

## 2013-11-19 DIAGNOSIS — I10 Essential (primary) hypertension: Secondary | ICD-10-CM

## 2013-11-19 DIAGNOSIS — E538 Deficiency of other specified B group vitamins: Secondary | ICD-10-CM

## 2013-11-19 LAB — CBC WITH DIFFERENTIAL/PLATELET
BASOS ABS: 0.1 10*3/uL (ref 0.0–0.1)
Basophils Relative: 0.9 % (ref 0.0–3.0)
Eosinophils Absolute: 0.1 10*3/uL (ref 0.0–0.7)
Eosinophils Relative: 2.4 % (ref 0.0–5.0)
HCT: 34 % — ABNORMAL LOW (ref 39.0–52.0)
Hemoglobin: 11 g/dL — ABNORMAL LOW (ref 13.0–17.0)
LYMPHS PCT: 27.7 % (ref 12.0–46.0)
Lymphs Abs: 1.7 10*3/uL (ref 0.7–4.0)
MCHC: 32.2 g/dL (ref 30.0–36.0)
MCV: 82.5 fl (ref 78.0–100.0)
MONOS PCT: 8.8 % (ref 3.0–12.0)
Monocytes Absolute: 0.5 10*3/uL (ref 0.1–1.0)
Neutro Abs: 3.6 10*3/uL (ref 1.4–7.7)
Neutrophils Relative %: 60.2 % (ref 43.0–77.0)
PLATELETS: 305 10*3/uL (ref 150.0–400.0)
RBC: 4.13 Mil/uL — ABNORMAL LOW (ref 4.22–5.81)
RDW: 14.8 % (ref 11.5–15.5)
WBC: 6 10*3/uL (ref 4.0–10.5)

## 2013-11-19 LAB — FERRITIN: FERRITIN: 9.3 ng/mL — AB (ref 22.0–322.0)

## 2013-11-19 LAB — IBC PANEL
IRON: 21 ug/dL — AB (ref 42–165)
SATURATION RATIOS: 4.3 % — AB (ref 20.0–50.0)
Transferrin: 348.9 mg/dL (ref 212.0–360.0)

## 2013-11-19 NOTE — Progress Notes (Signed)
Subjective:    Patient ID: [name] [name], male    DOB: [birth year], 80 y.o.   MRN: [id number]  HPI  [facility] Hgb 10.4 normocytic from Nov 15, 2013. No overt bleeding. Does c/o ongoing fatigue, but denies signficant daytime hypersomnolence, not clear if related to anything specific. Pt denies chest pain, increased sob or doe, wheezing, orthopnea, PND, increased LE swelling, palpitations, dizziness or syncope.  Pt denies new neurological symptoms such as new headache, or facial or extremity weakness or numbness   Pt denies polydipsia, polyuria,  Pt denies fever, wt loss, night sweats, loss of appetite, or other constitutional symptoms, Does c/o ongoing fatigue, but denies signficant daytime hypersomnolence. Past Medical History  Diagnosis Date  . ALLERGIC RHINITIS   . CORONARY ARTERY DISEASE   . DYSLIPIDEMIA   . ERECTILE DYSFUNCTION, ORGANIC   . GERD   . LUNG NODULE 01/25/2010  . MACULAR DEGENERATION, BILATERAL 01/30/2009  . OSTEOPOROSIS   . PERIPHERAL VASCULAR DISEASE   . Plantar wart 09/10/2009  . ROTATOR CUFF TEAR 11/01/2006  . SPONDYLOSIS, LUMBAR 11/01/2006  . Kidney stones     right  . Blood in urine   . H/O hiatal hernia   . HYPERTENSION     dr Excell Seltzercooper  . History of cardiovascular stress test     Lexiscan Myoview (8/15): No ischemia, not gated, normal study   Past Surgical History  Procedure Laterality Date  . Cataract extraction, bilateral    . Tonsillectomy    . Inguinal herniorrhapy bilateral    . Ganglion cyst excision  1960  . Cystoscopy with ureteroscopy  11/23/2010    Procedure: CYSTOSCOPY WITH URETEROSCOPY;  Surgeon: Antony HasteMatthew Ramsey Eskridge, MD;  [facility] [place];  Service: Urology;  Laterality: Right;  Prostate exam under anesthesia   . Stone extraction with basket  11/23/2010    Procedure: STONE EXTRACTION WITH BASKET;  Surgeon: Antony HasteMatthew Ramsey Eskridge, MD;  [facility];  Service: Urology;   Laterality: Right;  . Hernia repair    . Cardiac catheterization      06     [place].  . Eye surgery    . Inguinal hernia repair  04/15/2011    Procedure: HERNIA REPAIR INGUINAL ADULT;  Surgeon: Shelly Rubensteinouglas A Blackman, MD;  Location: [facility] OR;  Service: General;  Laterality: Right;  right inguinal hernia repair with mesh    reports that he has never smoked. He has never used smokeless tobacco. He reports that he drinks about 0.6 oz of alcohol per week. He reports that he does not use illicit drugs. family history includes Cancer in his brother; Heart attack in his brother, father, and mother. Allergies  Allergen Reactions  . Aciphex [Rabeprazole]   . Clarithromycin     Flushed face  . Protonix [Pantoprazole Sodium]   . Tadalafil     Flushed face   Current Outpatient Prescriptions on File Prior to Visit  Medication Sig Dispense Refill  . aspirin 81 MG tablet Take 81 mg by mouth daily.      . cholecalciferol (VITAMIN D) 1000 UNITS tablet Take 1,000 Units by mouth daily.      Marland Kitchen. esomeprazole (NEXIUM) 40 MG capsule Take 1 capsule (40 mg total) by mouth daily. 30 capsule 5  . meclizine (ANTIVERT) 12.5 MG tablet TAKE 1 TABLET BY MOUTH THREE TIMES DAILY AS NEEDED FOR DIZZINESS OR FOR NAUSEA 30 tablet 6  . Multiple Vitamins-Minerals (CENTRUM SILVER PO) Take 1  tablet by mouth daily.    . nitroGLYCERIN (NITROSTAT) 0.4 MG SL tablet Place 1 tablet (0.4 mg total) under the tongue every 5 (five) minutes as needed. For chest pain 25 tablet 6  . tolterodine (DETROL LA) 4 MG 24 hr capsule Take 1 capsule (4 mg total) by mouth daily. 90 capsule 3  . traMADol (ULTRAM) 50 MG tablet 1 tab by mouth every 6 hrs as needed for pain 120 tablet 3   No current facility-administered medications on file prior to visit.   Review of Systems  Constitutional: Negative for unusual diaphoresis or other sweats  HENT: Negative for ringing in ear Eyes: Negative for double vision or worsening visual disturbance.  Respiratory:  Negative for choking and stridor.   Gastrointestinal: Negative for vomiting or other signifcant bowel change Genitourinary: Negative for hematuria or decreased urine volume.  Musculoskeletal: Negative for other MSK pain or swelling Skin: Negative for color change and worsening wound.  Neurological: Negative for tremors and numbness other than noted  Psychiatric/Behavioral: Negative for decreased concentration or agitation other than above       Objective:   Physical Exam BP 132/78 mmHg  Pulse 93  Temp(Src) 98.1 F (36.7 C) (Oral)  Ht 5' 7.5" (1.715 m)  Wt 119 lb (53.978 kg)  BMI 18.35 kg/m2  SpO2 94% VS noted,  Constitutional: Pt appears well-developed, well-nourished.  HENT: Head: NCAT.  Right Ear: External ear normal.  Left Ear: External ear normal.  Eyes: . Pupils are equal, round, and reactive to light. Conjunctivae and EOM are normal Neck: Normal range of motion. Neck supple.  Cardiovascular: Normal rate and regular rhythm.   Pulmonary/Chest: Effort normal and breath sounds normal.  Abd:  Soft, NT, ND, + BS Neurological: Pt is alert. Not confused , motor grossly intact Skin: Skin is warm. No rash Psychiatric: Pt behavior is normal. No agitation.     Assessment & Plan:

## 2013-11-19 NOTE — Assessment & Plan Note (Signed)
Etiology unclear, for f/u labs, cbc, ferritin, iron panel,  to f/u any worsening symptoms or concerns

## 2013-11-19 NOTE — Assessment & Plan Note (Signed)
Etiology unclear, Exam otherwise benign, to check labs as documented, follow with expectant management,  Lab Results  Component Value Date   WBC 4.9 09/06/2013   HGB 11.7* 09/06/2013   HCT 35.4* 09/06/2013   PLT 264.0 09/06/2013   GLUCOSE 68* 09/06/2013   CHOL 228* 09/06/2013   TRIG 78.0 09/06/2013   HDL 74.40 09/06/2013   LDLDIRECT 120.2 03/01/2012   LDLCALC 138* 09/06/2013   ALT 14 09/06/2013   AST 20 09/06/2013   NA 141 09/06/2013   K 4.5 09/06/2013   CL 107 09/06/2013   CREATININE 1.2 09/06/2013   BUN 26* 09/06/2013   CO2 30 09/06/2013   TSH 0.93 09/06/2013   HGBA1C 5.7 05/12/2009

## 2013-11-19 NOTE — Patient Instructions (Signed)
Please continue all other medications as before, and refills have been done if requested.  Please have the pharmacy call with any other refills you may need.  Please continue your efforts at being more active, low cholesterol diet, and weight control.  You are otherwise up to date with prevention measures today.  Please keep your appointments with your specialists as you may have planned  Please go to the LAB in the Basement (turn left off the elevator) for the tests to be done today  You will be contacted by phone if any changes need to be made immediately.  Otherwise, you will receive a letter about your results with an explanation, but please check with MyChart first.  Please remember to sign up for MyChart if you have not done so, as this will be important to you in the future with finding out test results, communicating by private email, and scheduling acute appointments online when needed.  Please return in 3 months, or sooner if needed 

## 2013-11-19 NOTE — Progress Notes (Signed)
Pre visit review using our clinic review tool, if applicable. No additional management support is needed unless otherwise documented below in the visit note. 

## 2013-11-19 NOTE — Assessment & Plan Note (Signed)
stable overall by history and exam, recent data reviewed with pt, and pt to continue medical treatment as before,  to f/u any worsening symptoms or concerns BP Readings from Last 3 Encounters:  11/19/13 132/78  09/06/13 104/70  08/08/13 126/74

## 2013-11-20 ENCOUNTER — Other Ambulatory Visit: Payer: Self-pay | Admitting: Internal Medicine

## 2013-11-20 DIAGNOSIS — D509 Iron deficiency anemia, unspecified: Secondary | ICD-10-CM

## 2013-11-20 MED ORDER — FERROUS SULFATE 325 (65 FE) MG PO TBEC: 325.0000 mg | DELAYED_RELEASE_TABLET | Freq: Three times a day (TID) | ORAL | Status: AC

## 2013-12-03 ENCOUNTER — Encounter: Payer: Self-pay | Admitting: Physician Assistant

## 2013-12-03 ENCOUNTER — Ambulatory Visit (INDEPENDENT_AMBULATORY_CARE_PROVIDER_SITE_OTHER): Payer: Medicare Other | Admitting: Physician Assistant

## 2013-12-03 VITALS — BP 150/80 | HR 80 | Ht 64.0 in | Wt 117.1 lb

## 2013-12-03 DIAGNOSIS — R634 Abnormal weight loss: Secondary | ICD-10-CM

## 2013-12-03 DIAGNOSIS — I2510 Atherosclerotic heart disease of native coronary artery without angina pectoris: Secondary | ICD-10-CM

## 2013-12-03 DIAGNOSIS — D649 Anemia, unspecified: Secondary | ICD-10-CM

## 2013-12-03 NOTE — Patient Instructions (Signed)
As discussed with Lawson FiscalLori, you will need to find a GI doctor in [place] as soon as possible to schedule a colonoscopy.  We will mail you a copy of your records from today's visit to your new [place] address

## 2013-12-03 NOTE — Progress Notes (Addendum)
Patient ID: [name] [name], male   DOB: 1924-02-03, 80 y.o.   MRN: [id number]     History of Present Illness: [name] [name] is an 80 year old male with past medical history of coronary artery disease, dyslipidemia, osteoporosis, peripheral vascular disease, lumbar spondylosis, kidney stones, hypertension and hiatal hernia who was seen by Dr. Rhea BeltonPyrtle in October 2014 at the request of Dr. Excell Seltzerooper for evaluation of weight loss and abdominal pain. At the time he had lost approximately 15 pounds over the 6 months preceding his visit. He had reported a decreased appetite and occasional epigastric abdominal pain. He had no change in his bowel habits or stool caliber. He was sent for an abdominal CT which was nonrevealing and he had a CBC CMP and TSH. His hemoglobin at the time was 12.9.  Recently he was seen by Dr. Oliver BarreJames John and found to be anemic. The patient denies any change in his bowel habits or stool caliber. He has not had any bloody or tarry stools. He reports he has lost an additional 15 pounds since he was seen last year. He has no nausea or vomiting. He states his appetite just isn't what it used to be.He denies chest pain, shortness of breath, or dyspnea on exertion. He denies wheezing, orthopnea, or swelling of the lower extremities. He denies dizziness or syncope. He reports that he had a colonoscopy approximately 10 years ago but he doesn't remember in which state he had it done because he has moved so many times. The patient also states he is moving to [place] in 4 days so he is unable to schedule any testing here.   Past Medical History  Diagnosis Date  . ALLERGIC RHINITIS   . CORONARY ARTERY DISEASE   . DYSLIPIDEMIA   . ERECTILE DYSFUNCTION, ORGANIC   . GERD   . LUNG NODULE 01/25/2010  . MACULAR DEGENERATION, BILATERAL 01/30/2009  . OSTEOPOROSIS   . PERIPHERAL VASCULAR DISEASE   . Plantar wart 09/10/2009  . ROTATOR CUFF TEAR 11/01/2006  . SPONDYLOSIS, LUMBAR 11/01/2006  . Kidney stones    right  . Blood in urine   . H/O hiatal hernia   . HYPERTENSION     dr Excell Seltzercooper  . History of cardiovascular stress test     Lexiscan Myoview (8/15): No ischemia, not gated, normal study  . Arthritis     Past Surgical History  Procedure Laterality Date  . Cataract extraction, bilateral    . Tonsillectomy    . Inguinal herniorrhapy bilateral    . Ganglion cyst excision  1960  . Cystoscopy with ureteroscopy  11/23/2010    Procedure: CYSTOSCOPY WITH URETEROSCOPY;  Surgeon: Antony HasteMatthew Ramsey Eskridge, MD;  [facility] [place];  Service: Urology;  Laterality: Right;  Prostate exam under anesthesia   . Stone extraction with basket  11/23/2010    Procedure: STONE EXTRACTION WITH BASKET;  Surgeon: Antony HasteMatthew Ramsey Eskridge, MD;  [facility] [place];  Service: Urology;  Laterality: Right;  . Cardiac catheterization      06     [place].  . Inguinal hernia repair  04/15/2011    Procedure: HERNIA REPAIR INGUINAL ADULT;  Surgeon: Shelly Rubensteinouglas A Blackman, MD;  Location: [facility] OR;  Service: General;  Laterality: Right;  right inguinal hernia repair with mesh   Family History  Problem Relation Age of Onset  . Heart attack Mother   . Heart attack Father   . Heart attack Brother   . Colon cancer Brother   . Breast cancer  Daughter    History  Substance Use Topics  . Smoking status: Never Smoker   . Smokeless tobacco: Never Used  . Alcohol Use: 0.6 oz/week    1 Cans of beer per week     Comment: occasional   Current Outpatient Prescriptions  Medication Sig Dispense Refill  . aspirin 81 MG tablet Take 81 mg by mouth daily.      . cholecalciferol (VITAMIN D) 1000 UNITS tablet Take 1,000 Units by mouth daily.      Marland Kitchen. esomeprazole (NEXIUM) 40 MG capsule Take 1 capsule (40 mg total) by mouth daily. 30 capsule 5  . ferrous sulfate 325 (65 FE) MG EC tablet Take 1 tablet (325 mg total) by mouth 3 (three) times daily with meals. 90 tablet 1  . meclizine (ANTIVERT) 12.5 MG tablet TAKE 1  TABLET BY MOUTH THREE TIMES DAILY AS NEEDED FOR DIZZINESS OR FOR NAUSEA 30 tablet 6  . Multiple Vitamins-Minerals (CENTRUM SILVER PO) Take 1 tablet by mouth daily.    . nitroGLYCERIN (NITROSTAT) 0.4 MG SL tablet Place 1 tablet (0.4 mg total) under the tongue every 5 (five) minutes as needed. For chest pain 25 tablet 6  . tolterodine (DETROL LA) 4 MG 24 hr capsule Take 1 capsule (4 mg total) by mouth daily. 90 capsule 3  . traMADol (ULTRAM) 50 MG tablet 1 tab by mouth every 6 hrs as needed for pain 120 tablet 3   No current facility-administered medications for this visit.   Allergies  Allergen Reactions  . Aciphex [Rabeprazole]   . Clarithromycin     Flushed face  . Protonix [Pantoprazole Sodium]   . Tadalafil     Flushed face      Review of Systems: Gen: Denies any fever, chills, sweats, anorexia, fatigue, weakness, malaise, and sleep disorder CV: Denies chest pain, angina, palpitations, syncope, orthopnea, PND, peripheral edema, and claudication. Resp: Denies dyspnea at rest, dyspnea with exercise, cough, sputum, wheezing, coughing up blood, and pleurisy. GI: Denies vomiting blood, jaundice, and fecal incontinence.   Denies dysphagia or odynophagia. GU : Denies urinary burning, blood in urine, urinary frequency, urinary hesitancy, nocturnal urination, and urinary incontinence. MS: Denies joint pain, limitation of movement, and swelling, stiffness, low back pain, extremity pain. Denies muscle weakness, cramps, atrophy.  Derm: Denies rash, itching, dry skin, hives, moles, warts, or unhealing ulcers.  Psych: Denies depression, anxiety, memory loss, suicidal ideation, hallucinations, paranoia, and confusion. Heme: Denies bruising, bleeding, and enlarged lymph nodes. Neuro:  Denies any headaches, dizziness, paresthesia Endo:  Denies any problems with DM, thyroid, adrenal  LAB RESULTS: CBC on 10/30/2012 had white blood cells 6.4, hemoglobin 12.9, hematocrit 38.1, platelets 233,000, MCV  91.8. CBC on 09/06/2013 had white blood cell count 4.9, hemoglobin 11.7, hematocrit 35.4, platelets 264,000. CBC on 11/19/2013 had CBC with white blood cell 6.0, hemoglobin 11, hematocrit 34, platelets 305,000. Iron 21. Transferrin 348.9. Saturation ratio 4.3. Ferritin 9.3.   Studies:   Abdomen Pelvis W Contrast   Status: Final result       PACS Images     Show images for CT Abdomen Pelvis W Contrast     Study Result     CLINICAL DATA: Left lower quadrant tenderness  EXAM: CT ABDOMEN AND PELVIS WITH CONTRAST  TECHNIQUE: Multidetector CT imaging of the abdomen and pelvis was performed using the standard protocol following bolus administration of intravenous contrast.  CONTRAST: 100mL OMNIPAQUE IOHEXOL 300 MG/ML SOLN  COMPARISON: 11/05/2010  FINDINGS: The lung bases are clear.  The liver demonstrates no focal abnormality. There is no intrahepatic or extrahepatic biliary ductal dilatation. The gallbladder is normal. The spleen demonstrates no focal abnormality. The kidneys, adrenal glands and pancreas are normal. The bladder is unremarkable. The prostate gland is mildly enlarged measuring 5.4 x 4.8 cm.  There is a moderate size hiatal hernia. The duodenum, small intestine, and large intestine demonstrate no contrast extravasation or dilatation. There is no pneumoperitoneum, pneumatosis, or portal venous gas. There is no abdominal or pelvic free fluid. There is no lymphadenopathy.  The abdominal aorta is normal in caliber with atherosclerosis.  There are no lytic or sclerotic osseous lesions. 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 There is grade 1 anterolisthesis of L5 on S1. With severe bilateral foraminal stenosis at L5-S1. There is degenerative disk disease and degenerative facet arthropathy throughout the lumbar spine. There is an S-shaped scoliosis of the thoracolumbar spine.  IMPRESSION: 1. No acute abdominal or pelvic  pathology.  2. Moderate hiatal hernia.  3. Lumbar spine spondylosis.   Electronically Signed  By: Elige Ko  On: 10/31/2012 10:35      Physical Exam: General: Pleasant, well developed , elderly male in no acute distress Head: Normocephalic and atraumatic Eyes:  sclerae anicteric, conjunctiva pink  Ears: Normal auditory acuity Lungs: Clear throughout to auscultation Heart: Regular rate and rhythm Abdomen: Soft, non distended, non-tender. No masses, no hepatomegaly. Normal bowel sounds Rectal:deferred per pt Musculoskeletal: Symmetrical with no gross deformities  Extremities: No edema  Neurological: Alert oriented x 4, grossly nonfocal Psychological:  Alert and cooperative. Normal mood and affect  Assessment and Recommendations: 80 year old male recently found to be anemic along with abnormal weight loss referred for evaluation. We've spent a considerable amount of time reviewing colonoscopy and upper endoscopy has part of his workup. The patient is amenable to this however, he is relocating to [place] at the end of the week. The patient has been advised to find a gastroenterologist as soon as he can in [place] to continue his workup of anemia and weight loss.   Candy Ziegler, Tollie Pizza PA-C 12/03/2013,  Addendum: Reviewed and agree with management. Given continued weight loss and now iron deficiency anemia agree with upper endoscopy and colonoscopy. Patient moving to [place] and should see gastroenterology as soon as possible when he gets to [place], [place]

## 2014-01-10 ENCOUNTER — Telehealth: Payer: Self-pay | Admitting: Physician Assistant

## 2014-01-10 NOTE — Telephone Encounter (Signed)
OK, thank you.

## 2014-01-29 ENCOUNTER — Other Ambulatory Visit: Payer: Self-pay | Admitting: *Deleted

## 2014-01-29 MED ORDER — ESOMEPRAZOLE MAGNESIUM 40 MG PO CPDR: 40.0000 mg | DELAYED_RELEASE_CAPSULE | Freq: Every day | ORAL | Status: AC

## 2014-02-19 ENCOUNTER — Ambulatory Visit: Payer: Medicare Other | Admitting: Internal Medicine

## 2014-02-24 ENCOUNTER — Other Ambulatory Visit: Payer: Self-pay | Admitting: Internal Medicine

## 2014-02-25 NOTE — Telephone Encounter (Signed)
Done hardcopy to Cherina  

## 2014-02-25 NOTE — Telephone Encounter (Signed)
Faxed script back to [facility].../lmb 

## 2014-03-12 ENCOUNTER — Ambulatory Visit: Payer: Medicare Other | Admitting: Internal Medicine

## 2014-12-26 ENCOUNTER — Other Ambulatory Visit: Payer: Self-pay | Admitting: Internal Medicine

## 2015-03-17 ENCOUNTER — Telehealth: Payer: Self-pay | Admitting: Internal Medicine

## 2015-03-17 NOTE — Telephone Encounter (Signed)
Informed patient of MD response. Patient has informed me that he has a new internal medicine physician in [place].  He has moved there.  Patient states he is going to request the refill from this other provider.  Patient did say he would be traveling this way next month and might make an appointment with Dr. Jonny RuizJohn.  I informed patient that he needed to check with insurance in this regard to see if he can have two primary care providers.

## 2015-03-17 NOTE — Telephone Encounter (Signed)
unfort unable to refill tramadol due to being over 1 yr since last seen  Please ask pt to make ROV -

## 2015-03-17 NOTE — Telephone Encounter (Signed)
Please advise, ok to refill? 

## 2015-04-18 IMAGING — CT CT HEAD W/O CM
1 series · 16 of 30 positions shown, 20 images · non-contrast
Comparison: None.

CLINICAL DATA: Syncope with fall with questionable hitting head
late September 2012. Dizziness and vertigo since.

EXAM:
CT HEAD WITHOUT CONTRAST
TECHNIQUE: Contiguous axial images were obtained from the base of the skull
through the vertex without intravenous contrast.

[Series 2: head_seq -c 4.5 h37s st · axial · 0.43mm/px · z∈[-105,+39]mm · 16 of 36 slices shown, 20 images]
[im 2/36  brain]
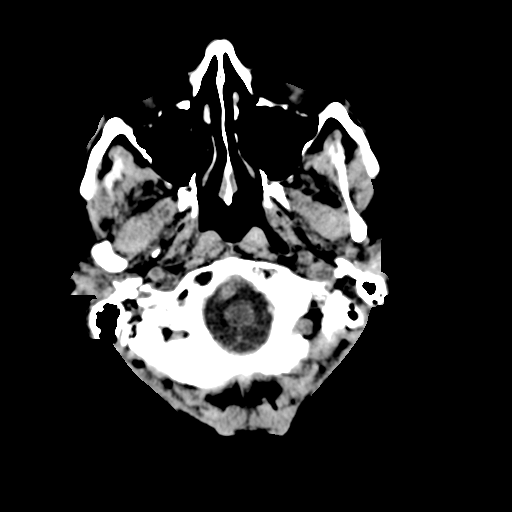
[im 2/36  bone]
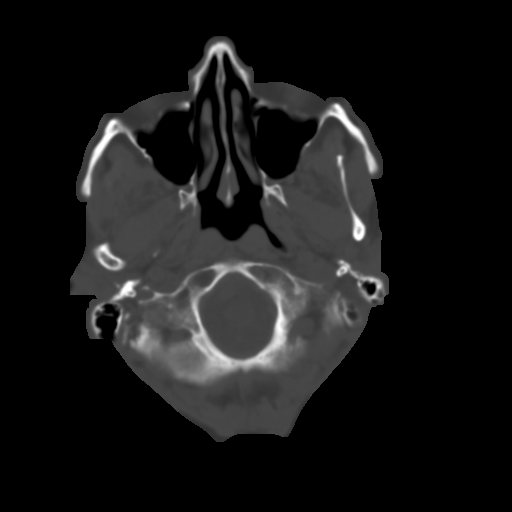
[im 4/36  brain]
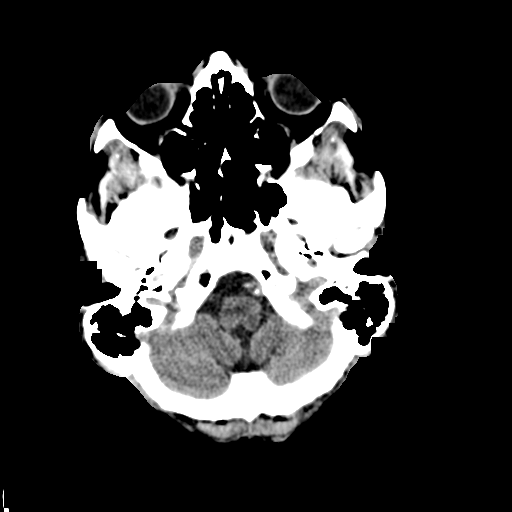
[im 7/36  brain]
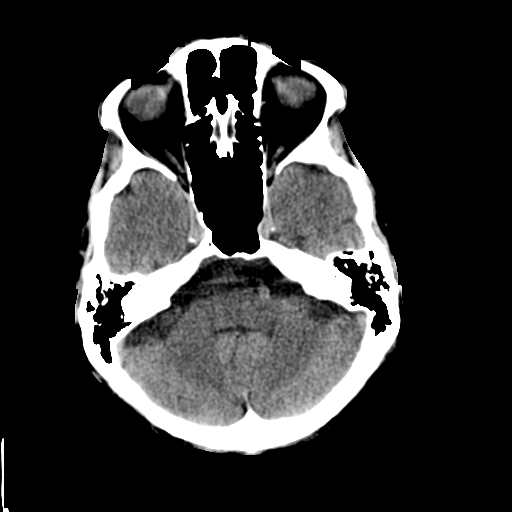
[im 9/36  brain]
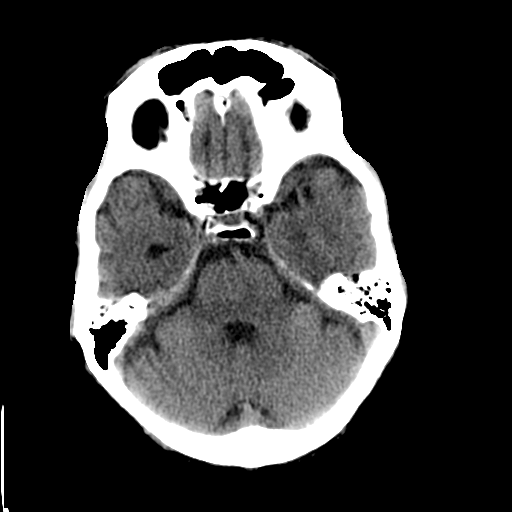
[im 10/36  brain]
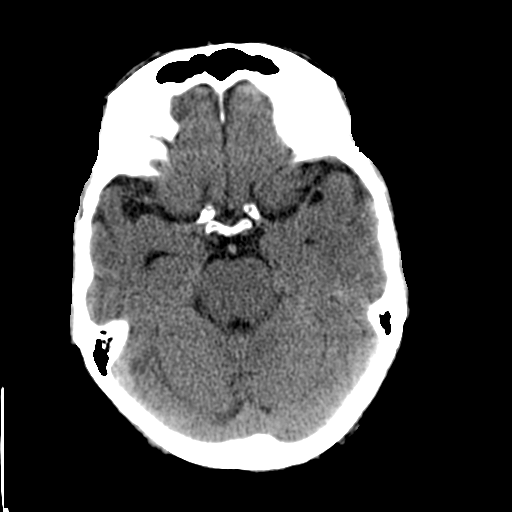
[im 10/36  bone]
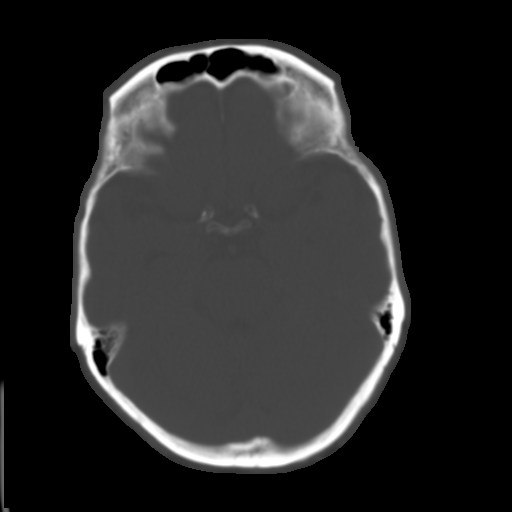
[im 13/36  brain]
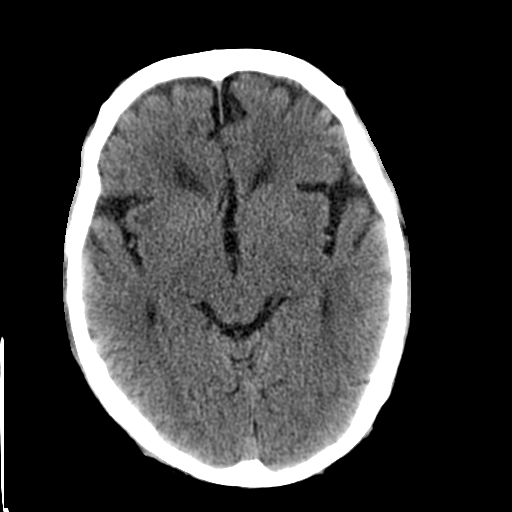
[im 15/36  brain]
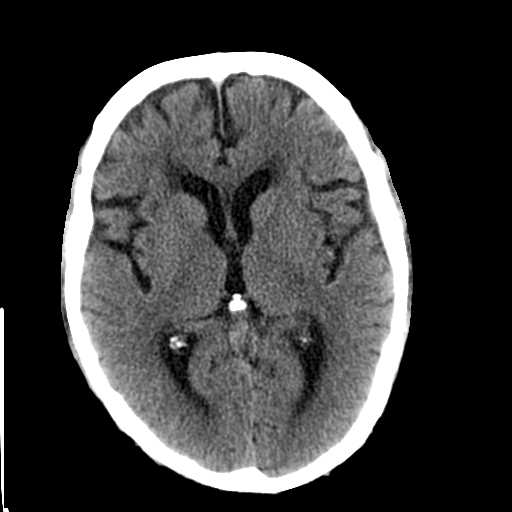
[im 17/36  brain]
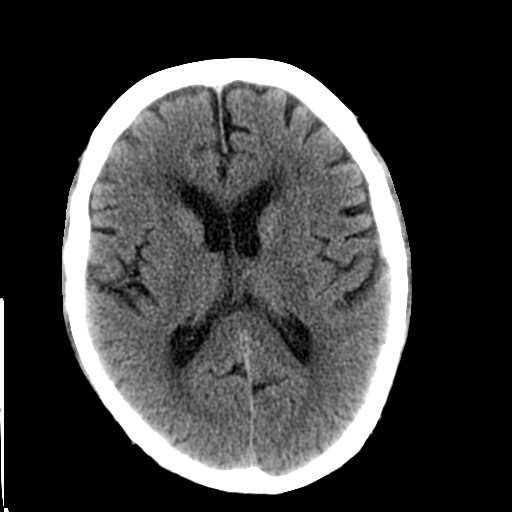
[im 19/36  brain]
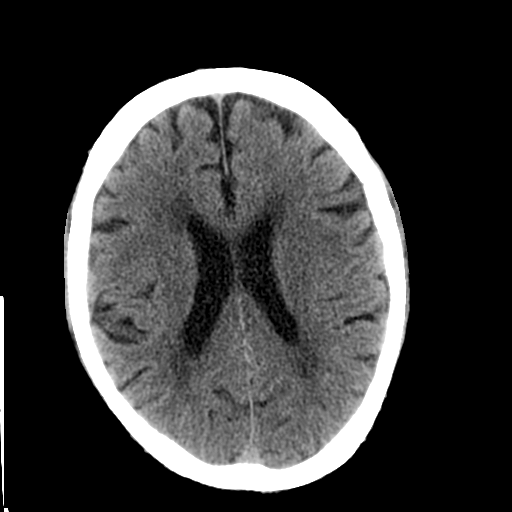
[im 19/36  bone]
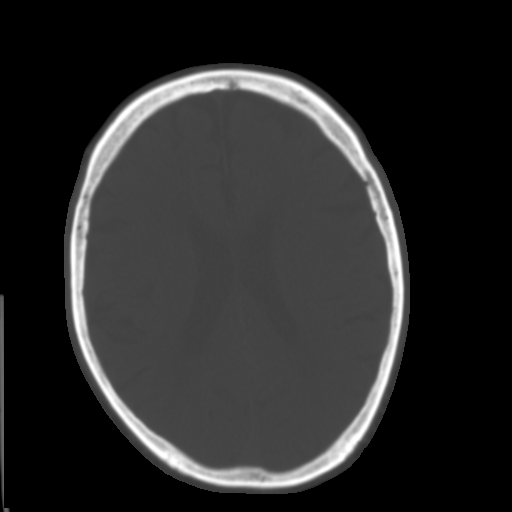
[im 21/36  brain]
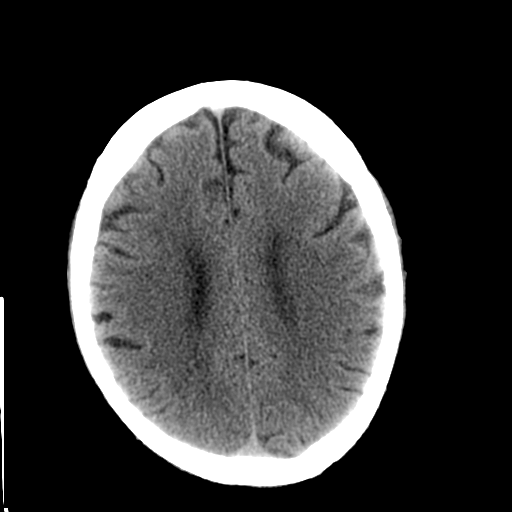
[im 23/36  brain]
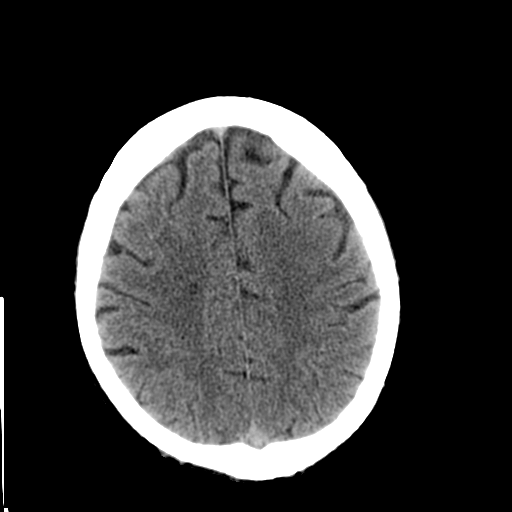
[im 26/36  brain]
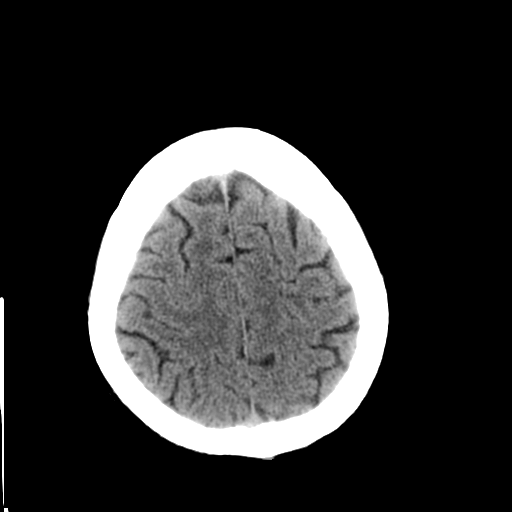
[im 27/36  brain]
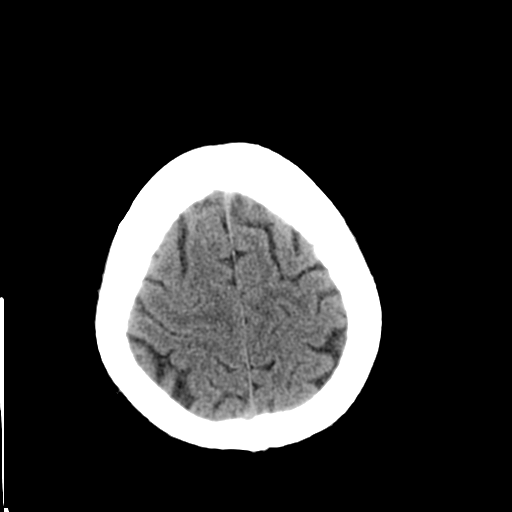
[im 27/36  bone]
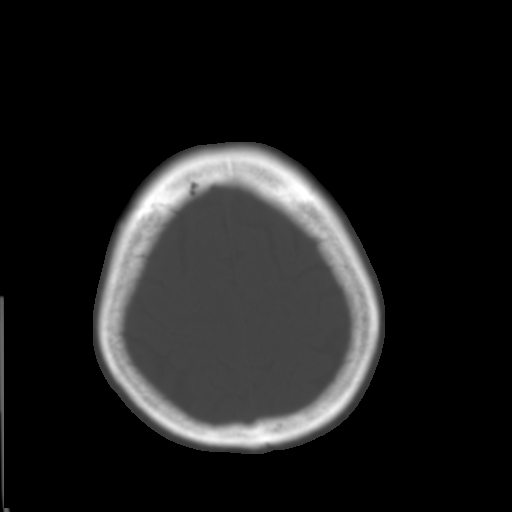
[im 29/36  brain]
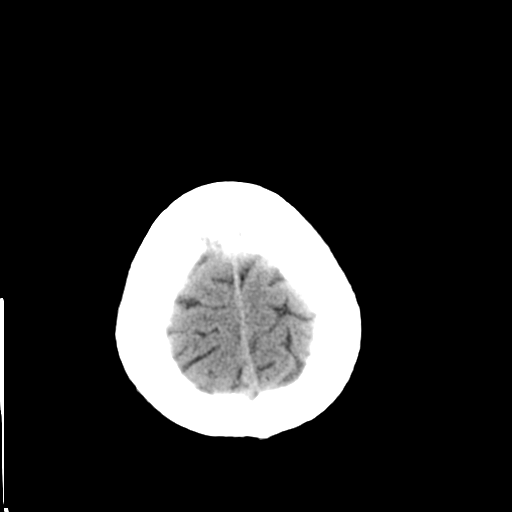
[im 32/36  brain]
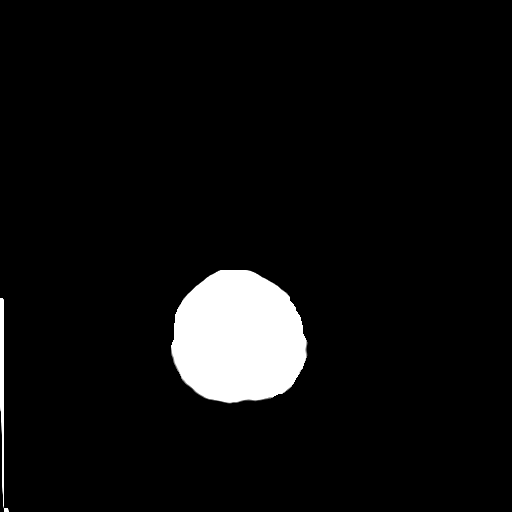
[im 34/36  brain]
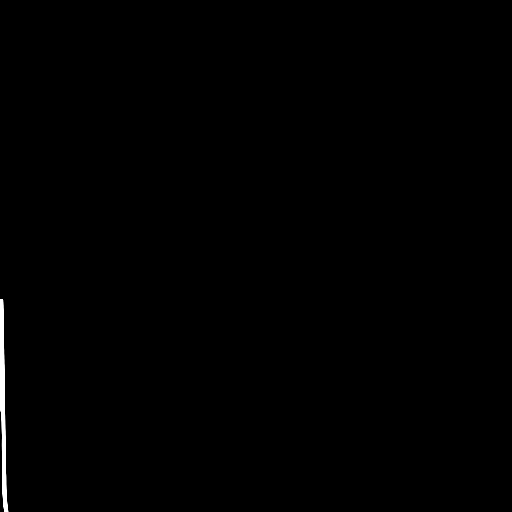

[16 of 30 positions shown; findings below may reference images not displayed]

FINDINGS: No skull fracture or intracranial hemorrhage.

Small vessel disease type changes without CT evidence of large acute
infarct.

No intracranial mass lesion noted on this unenhanced exam.

No hydrocephalus.

Vascular calcifications.
IMPRESSION: No skull fracture or intracranial hemorrhage.

Small vessel disease type changes without CT evidence of large acute
infarct.

## 2015-05-01 IMAGING — CT CT ABD-PELV W/ CM
2 of 5 series · 17 of 46 positions shown, 19 images · IV contrast (omnipaque)
Comparison: 11/05/2010

CLINICAL DATA: Left lower quadrant tenderness

EXAM:
CT ABDOMEN AND PELVIS WITH CONTRAST
TECHNIQUE: Multidetector CT imaging of the abdomen and pelvis was performed
using the standard protocol following bolus administration of
intravenous contrast.
CONTRAST:  100mL OMNIPAQUE IOHEXOL 300 MG/ML  SOLN

[Series 2: abd/ pel 5mm · axial · 0.62mm/px · z∈[-398,-54]mm · 14 of 79 slices shown, 16 images]
[im 5/79  soft-tissue]
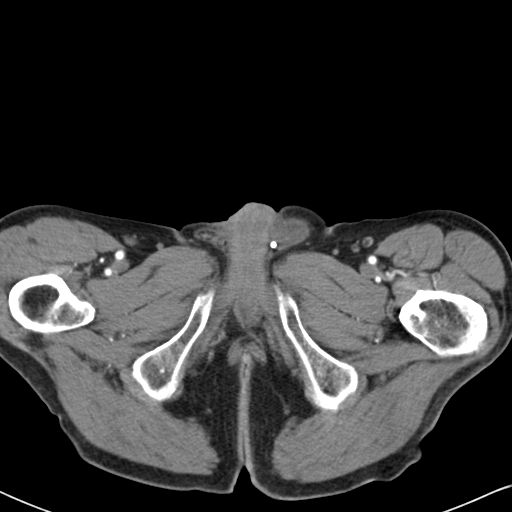
[im 5/79  bone]
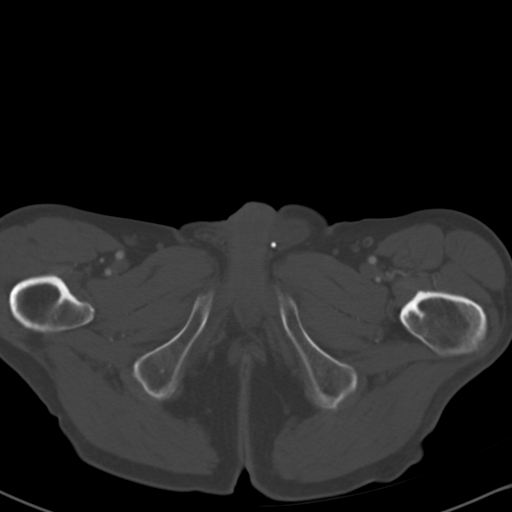
[im 9/79  soft-tissue]
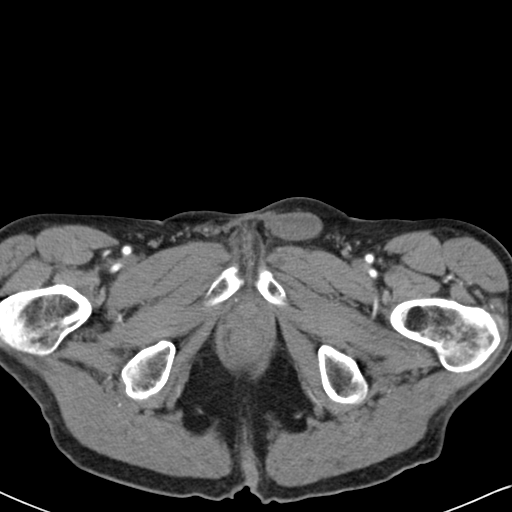
[im 18/79  soft-tissue]
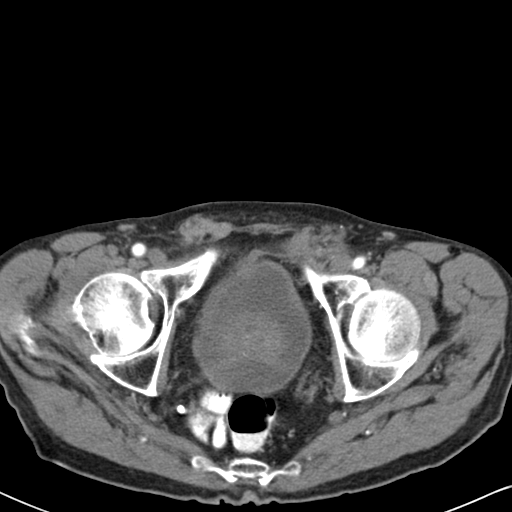
[im 22/79  soft-tissue]
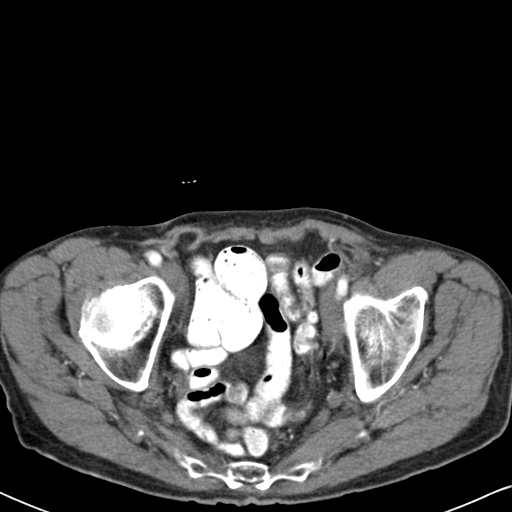
[im 27/79  soft-tissue]
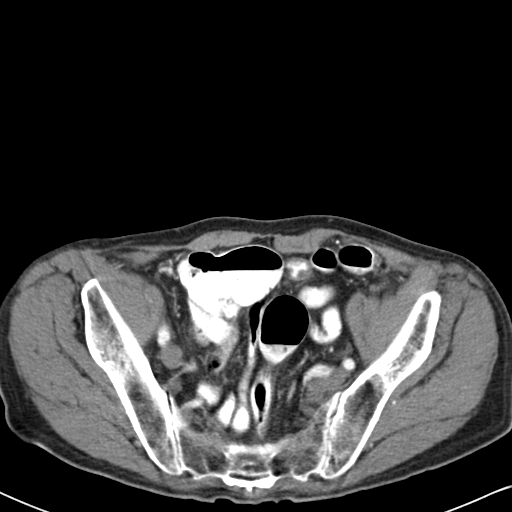
[im 31/79  soft-tissue]
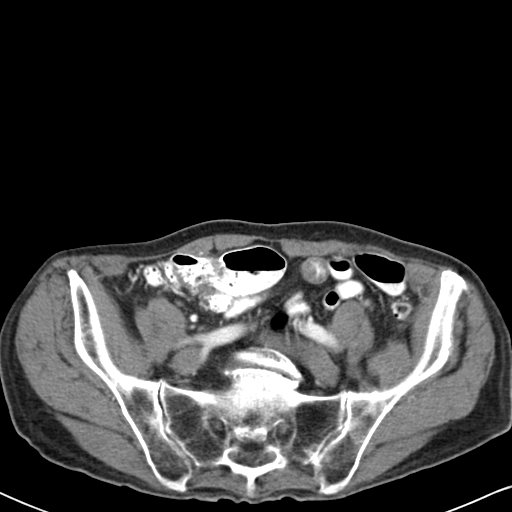
[im 35/79  soft-tissue]
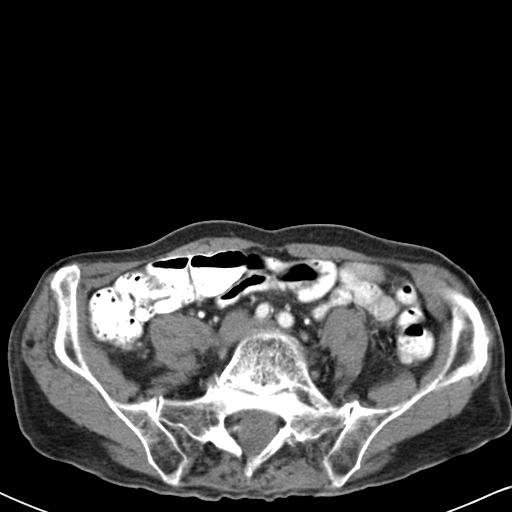
[im 44/79  soft-tissue]
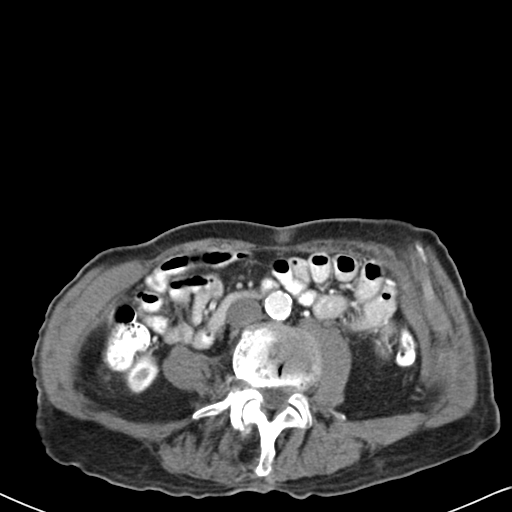
[im 48/79  soft-tissue]
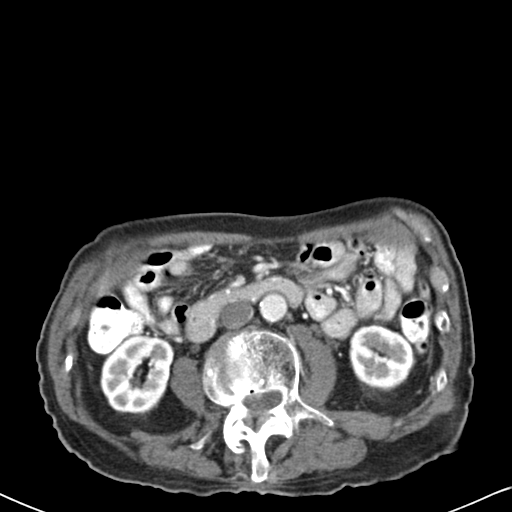
[im 48/79  bone]
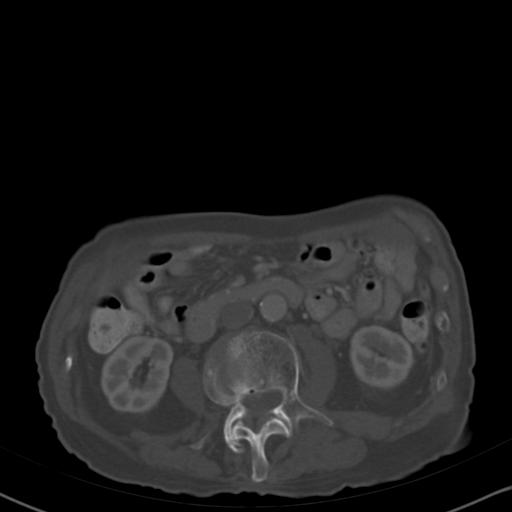
[im 53/79  soft-tissue]
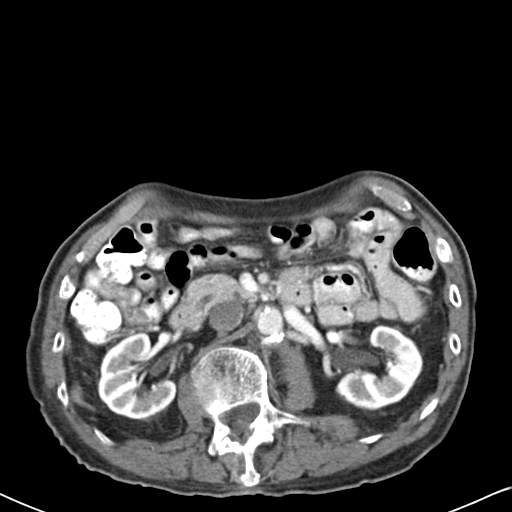
[im 57/79  soft-tissue]
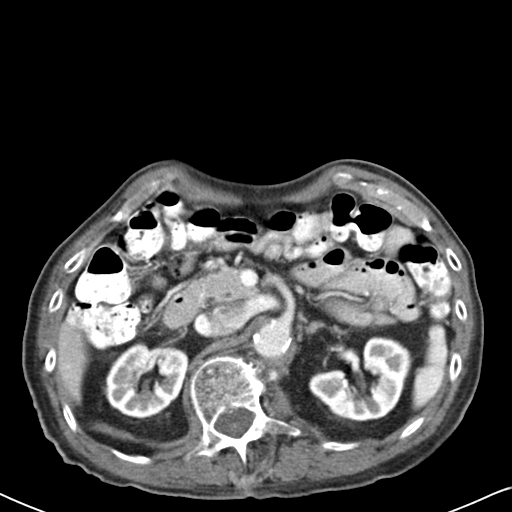
[im 61/79  soft-tissue]
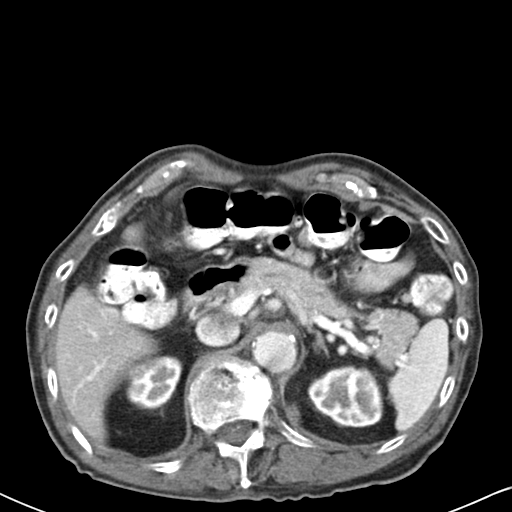
[im 70/79  soft-tissue]
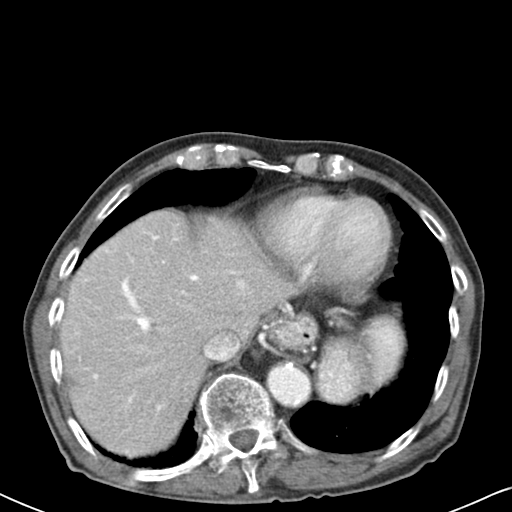
[im 74/79  soft-tissue]
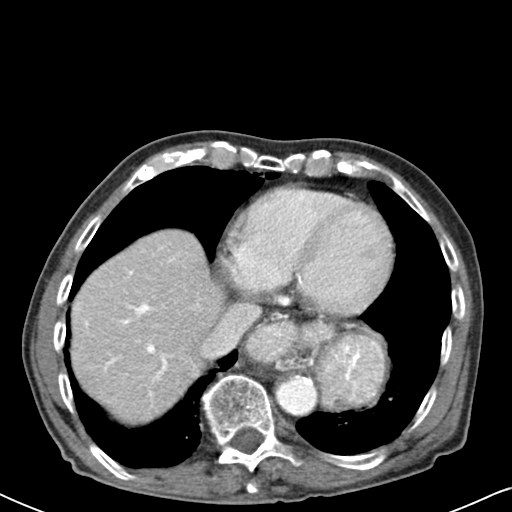

[Series 602: cor · coronal · 0.80mm/px · 3 of 101 slices shown]
[im 34/101  soft-tissue]
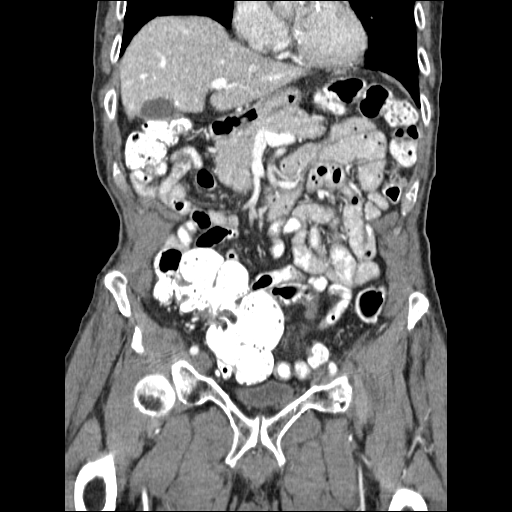
[im 45/101  soft-tissue]
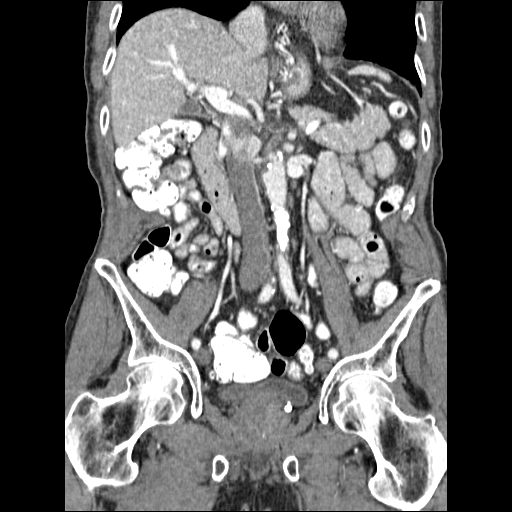
[im 56/101  soft-tissue]
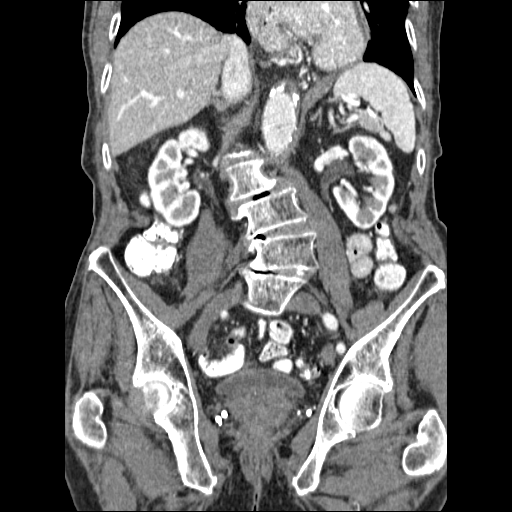

[17 of 46 positions shown; findings below may reference images not displayed]

FINDINGS: The lung bases are clear.

The liver demonstrates no focal abnormality. There is no
intrahepatic or extrahepatic biliary ductal dilatation. The
gallbladder is normal. The spleen demonstrates no focal abnormality.
The kidneys, adrenal glands and pancreas are normal. The bladder is
unremarkable. The prostate gland is mildly enlarged measuring 5.4 x
4.8 cm.

There is a moderate size hiatal hernia. The duodenum, small
intestine, and large intestine demonstrate no contrast extravasation
or dilatation. There is no pneumoperitoneum, pneumatosis, or portal
venous gas. There is no abdominal or pelvic free fluid. There is no
lymphadenopathy.

The abdominal aorta is normal in caliber with atherosclerosis.

There are no lytic or sclerotic osseous lesions. There are bilateral
L5 pars interarticularis defects. There is grade 1 anterolisthesis
of L5 on S1. With severe bilateral foraminal stenosis at L5-S1.
There is degenerative disk disease and degenerative facet
arthropathy throughout the lumbar spine. There is an S-shaped
scoliosis of the thoracolumbar spine.
IMPRESSION: 1. No acute abdominal or pelvic pathology.

2. Moderate hiatal hernia.

3.  Lumbar spine spondylosis.

## 2020-04-03 DEATH — deceased
